# Patient Record
Sex: Male | Born: 1985
Health system: Southern US, Community
[De-identification: ages and names within clinical notes are randomized; demographics above are authoritative.]

## PROBLEM LIST (undated history)

## (undated) ENCOUNTER — Emergency Department (HOSPITAL_COMMUNITY): Admission: EM | Discharge status: Absent without leave | Payer: Private Health Insurance - Indemnity

## (undated) DIAGNOSIS — T7840XA Allergy, unspecified, initial encounter: Secondary | ICD-10-CM

## (undated) DIAGNOSIS — J45909 Unspecified asthma, uncomplicated: Secondary | ICD-10-CM

## (undated) HISTORY — DX: Allergy, unspecified, initial encounter: T78.40XA

## (undated) HISTORY — PX: NO PAST SURGERIES: SHX2092

## (undated) HISTORY — DX: Unspecified asthma, uncomplicated: J45.909

---

## 2012-06-17 ENCOUNTER — Ambulatory Visit: Payer: Self-pay | Admitting: Physician Assistant

## 2012-06-17 VITALS — BP 127/86 | HR 60 | Temp 98.1°F | Resp 16 | Ht 70.5 in | Wt 153.0 lb

## 2012-06-17 DIAGNOSIS — J45909 Unspecified asthma, uncomplicated: Secondary | ICD-10-CM

## 2012-06-17 MED ORDER — ALBUTEROL SULFATE HFA 108 (90 BASE) MCG/ACT IN AERS: 2.0000 | INHALATION_SPRAY | RESPIRATORY_TRACT | Status: DC | PRN

## 2012-06-17 NOTE — Progress Notes (Signed)
  Subjective:    Patient ID: Noah Romero, male    DOB: 17-May-1986, 26 y.o.   MRN: [id number]  HPI Patient presents for refill of albuterol inhaler. Has well controlled asthma that only flares when exposed to environmental allergens. Typically uses his inhaler 1 or 2 times per month. Is here for a refill because he is going to [place] for 13 days and wants to have an extra just in case. Currently is out of his albuterol after increase in frequency due to cleaning the basement of his house.  No wheezing, SOB, nasal congestion, sore throat, or otalgia.     Review of Systems  All other systems reviewed and are negative.       Objective:   Physical Exam  Constitutional: He is oriented to person, place, and time. He appears well-developed and well-nourished.  HENT:  Head: Normocephalic and atraumatic.  Right Ear: Hearing, tympanic membrane, external ear and ear canal normal.  Left Ear: Hearing, tympanic membrane, external ear and ear canal normal.  Mouth/Throat: Uvula is midline, oropharynx is clear and moist and mucous membranes are normal.  Eyes: Conjunctivae are normal.  Neck: Normal range of motion.  Cardiovascular: Normal rate, regular rhythm and normal heart sounds.   Pulmonary/Chest: Effort normal and breath sounds normal.  Lymphadenopathy:    He has no cervical adenopathy.  Neurological: He is alert and oriented to person, place, and time.  Psychiatric: He has a normal mood and affect. His behavior is normal. Judgment and thought content normal.          Assessment & Plan:   1. Asthma  Well controlled. Albuterol refilled for him to use as needed for wheezing Also recommend he take OTC Claritin or Zyrtec with him while he is in [place] to prevent flares due to allergens.

## 2013-09-13 ENCOUNTER — Ambulatory Visit (INDEPENDENT_AMBULATORY_CARE_PROVIDER_SITE_OTHER): Payer: Private Health Insurance - Indemnity | Admitting: Internal Medicine

## 2013-09-13 ENCOUNTER — Encounter: Payer: Self-pay | Admitting: Internal Medicine

## 2013-09-13 VITALS — BP 153/93 | HR 62 | Temp 98.8°F | Ht 71.2 in | Wt 171.8 lb

## 2013-09-13 DIAGNOSIS — Z0000 Encounter for general adult medical examination without abnormal findings: Secondary | ICD-10-CM

## 2013-09-13 DIAGNOSIS — J45909 Unspecified asthma, uncomplicated: Secondary | ICD-10-CM | POA: Insufficient documentation

## 2013-09-13 MED ORDER — ALBUTEROL SULFATE HFA 108 (90 BASE) MCG/ACT IN AERS: 2.0000 | INHALATION_SPRAY | Freq: Four times a day (QID) | RESPIRATORY_TRACT | Status: DC | PRN

## 2013-09-13 NOTE — Patient Instructions (Signed)
Is come back fasting: FLP, CMP, CBC, TSH---- dx v70    Testicular Problems and Self-Exam Men can examine themselves easily and effectively with positive results. Monthly exams detect problems early and save lives. There are numerous causes of swelling in the testicle. Testicular cancer usually appears as a firm painless lump in the front part of the testicle. This may feel like a dull ache or heavy feeling located in the lower abdomen (belly), groin, or scrotum.  The risk is greater in men with undescended testicles and it is more common in young men. It is responsible for almost a fifth of cancers in males between ages 1 and 2. Other common causes of swellings, lumps, and testicular pain include injuries, inflammation (soreness) from infection, hydrocele, and torsion. These are a few of the reasons to do monthly self-examination of the testicles. The exam only takes minutes and could add years to your life. Get in the habit! SELF-EXAMINATION OF THE TESTICLES The testicles are easiest to examine after warm baths or showers and are more difficult to examine when you are cold. This is because the muscles attached to the testicles retract and pull them up higher or into the abdomen. While standing, roll one testicle between the thumb and forefinger. Feel for lumps, swelling, or discomfort. A normal testicle is egg shaped and feels firm. It is smooth and not tender. The spermatic cord can be felt as a firm spaghetti-like cord at the back of the testicle. It is also important to examine your groins. This is the crease between the front of your leg and your abdomen. Also, feel for enlarged lymph nodes (glands). Enlarged nodes are also a cause for you to see your caregiver for evaluation.  Self-examination of the testicles and groin areas on a regular basis will help you to know what your own testicles and groins feel like. This will help you pick up an abnormality (difference) at an earlier stage. Early  discovery is the key to curing this cancer or treating other conditions. Any lump, change, or swelling in the testicle calls for immediate evaluation by your caregiver. Cancer of the testicle does not result in impotence and it does not prevent normal intercourse or prevent having children. If your caregiver feels that medical treatment or chemotherapy could lead to infertility, sperm can be frozen for future use. It is necessary to see a caregiver as soon as possible after the discovery of a lump in a testicle. Document Released: 03/17/2001 Document Revised: 03/02/2012 Document Reviewed: 12/10/2008 [facility] Patient Information 2014 [place], [place].
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
## 2013-09-13 NOTE — Assessment & Plan Note (Signed)
Asthma as a child, hardly ever has any symptoms, will prescribe albuterol just in case.

## 2013-09-13 NOTE — Progress Notes (Signed)
  Subjective:    Patient ID: Noah Romero, male    DOB: 28-Jan-1986, 27 y.o.   MRN: [id number]  HPI New pt, requests a CPX  Past Medical History  Diagnosis Date  . Asthma    Past Surgical History  Procedure Laterality Date  . No past surgeries     History   Social History  . Marital Status: Single    Spouse Name: N/A    Number of Children: 0  . Years of Education: N/A   Occupational History  . bartender , college    Social History Main Topics  . Smoking status: Never Smoker   . Smokeless tobacco: Never Used  . Alcohol Use: Yes     Comment: socially  . Drug Use: No  . Sexual Activity: Not on file   Other Topics Concern  . Not on file   Social History Narrative   Attends college         Family History  Problem Relation Age of Onset  . Colon cancer Neg Hx   . Prostate cancer Other     GF dx in his 37  . CAD Other     GF  . Diabetes Neg Hx       Review of Systems Diet-- regular, plans to improve Exercise-- rock climbing, gym , active ~ 4 times per week No  CP, SOB, syncope No nausea, vomiting diarrhea No blood in the stools No cough, sputum production, no wheezing at present No dysuria, gross hematuria, difficulty urinating   No anxiety, depression Single but has a g-friend, not high risk sexual behavior        Objective:   Physical Exam BP 153/93  Pulse 62  Temp(Src) 98.8 F (37.1 C)  Ht 5' 11.2" (1.808 m)  Wt 171 lb 12.8 oz (77.928 kg)  BMI 23.84 kg/m2  SpO2 99% General -- alert, well-developed, NAD.  Neck --no thyromegaly , normal carotid pulse Lungs -- normal respiratory effort, no intercostal retractions, no accessory muscle use, and normal breath sounds.  Heart-- normal rate, regular rhythm, no murmur.  Abdomen-- Not distended, good bowel sounds,soft, non-tender. No rebound or rigidity.  Extremities-- no pretibial edema bilaterally  Neurologic--  alert & oriented X3. Speech normal, gait normal, strength normal in all extremities.    Psych-- Cognition and judgment appear intact. Cooperative with normal attention span and concentration. No anxious appearing , no depressed appearing.      Assessment & Plan:

## 2013-09-16 ENCOUNTER — Other Ambulatory Visit (INDEPENDENT_AMBULATORY_CARE_PROVIDER_SITE_OTHER): Payer: Private Health Insurance - Indemnity

## 2013-09-16 DIAGNOSIS — Z0000 Encounter for general adult medical examination without abnormal findings: Secondary | ICD-10-CM

## 2013-09-16 LAB — CBC WITH DIFFERENTIAL/PLATELET
Basophils Absolute: 0.1 10*3/uL (ref 0.0–0.1)
Basophils Relative: 1 % (ref 0.0–3.0)
Eosinophils Absolute: 0.3 10*3/uL (ref 0.0–0.7)
HCT: 41.5 % (ref 39.0–52.0)
Hemoglobin: 14.3 g/dL (ref 13.0–17.0)
Lymphocytes Relative: 47.9 % — ABNORMAL HIGH (ref 12.0–46.0)
Lymphs Abs: 2.5 10*3/uL (ref 0.7–4.0)
MCHC: 34.5 g/dL (ref 30.0–36.0)
MCV: 90.7 fl (ref 78.0–100.0)
Neutro Abs: 2 10*3/uL (ref 1.4–7.7)
RBC: 4.57 Mil/uL (ref 4.22–5.81)
RDW: 13.2 % (ref 11.5–14.6)

## 2013-09-16 LAB — COMPREHENSIVE METABOLIC PANEL
ALT: 35 U/L (ref 0–53)
AST: 29 U/L (ref 0–37)
Alkaline Phosphatase: 59 U/L (ref 39–117)
BUN: 13 mg/dL (ref 6–23)
Calcium: 9.5 mg/dL (ref 8.4–10.5)
Chloride: 100 mEq/L (ref 96–112)
Creatinine, Ser: 1 mg/dL (ref 0.4–1.5)
Total Bilirubin: 1.4 mg/dL — ABNORMAL HIGH (ref 0.3–1.2)

## 2013-09-16 LAB — LIPID PANEL
Cholesterol: 162 mg/dL (ref 0–200)
HDL: 60.2 mg/dL (ref >39.00)
LDL Cholesterol: 92 mg/dL (ref 0–99)
Total CHOL/HDL Ratio: 3
Triglycerides: 48 mg/dL (ref 0.0–149.0)
VLDL: 9.6 mg/dL (ref 0.0–40.0)

## 2013-09-20 ENCOUNTER — Encounter: Payer: Self-pay | Admitting: *Deleted

## 2013-09-20 ENCOUNTER — Telehealth: Payer: Self-pay | Admitting: *Deleted

## 2013-09-20 NOTE — Telephone Encounter (Signed)
Message copied by Eustace Quail on Mon Sep 20, 2013  1:29 PM ------      Message from: Wanda Plump      Created: Sat Sep 18, 2013  2:59 PM       Send a letter      Noah Romero,      Your cholesterol is very good at 162.      The the liver, kidney, potassium, blood count and thyroid tests are all normal.      These are good results! ------

## 2013-09-20 NOTE — Telephone Encounter (Signed)
Left message on patient voice mail that physical form was ready to be picked up.

## 2013-09-20 NOTE — Telephone Encounter (Signed)
Letter sent. DJR  

## 2013-09-21 ENCOUNTER — Ambulatory Visit (INDEPENDENT_AMBULATORY_CARE_PROVIDER_SITE_OTHER): Payer: Private Health Insurance - Indemnity

## 2013-09-21 DIAGNOSIS — Z23 Encounter for immunization: Secondary | ICD-10-CM

## 2013-12-24 ENCOUNTER — Encounter: Payer: Self-pay | Admitting: *Deleted

## 2013-12-24 NOTE — Telephone Encounter (Signed)
12/24/2013 Original form was at front desk.  Faxed to [phone].  Sent copy to batch.  Mailed original to patient.  bw

## 2015-01-13 ENCOUNTER — Emergency Department (HOSPITAL_COMMUNITY): Payer: Self-pay

## 2015-01-13 ENCOUNTER — Observation Stay (HOSPITAL_COMMUNITY)
Admission: EM | Admit: 2015-01-13 | Discharge: 2015-01-13 | Disposition: A | Discharge status: Home / Self Care | Payer: Self-pay | Attending: General Surgery | Admitting: General Surgery

## 2015-01-13 ENCOUNTER — Encounter (HOSPITAL_COMMUNITY): Payer: Self-pay | Admitting: Radiology

## 2015-01-13 DIAGNOSIS — J384 Edema of larynx: Secondary | ICD-10-CM | POA: Insufficient documentation

## 2015-01-13 DIAGNOSIS — S060XAA Concussion with loss of consciousness status unknown, initial encounter: Secondary | ICD-10-CM | POA: Diagnosis present

## 2015-01-13 DIAGNOSIS — S0102XA Laceration with foreign body of scalp, initial encounter: Principal | ICD-10-CM | POA: Insufficient documentation

## 2015-01-13 DIAGNOSIS — S0001XA Abrasion of scalp, initial encounter: Secondary | ICD-10-CM | POA: Diagnosis present

## 2015-01-13 DIAGNOSIS — S060X0A Concussion without loss of consciousness, initial encounter: Secondary | ICD-10-CM | POA: Insufficient documentation

## 2015-01-13 DIAGNOSIS — Y92410 Unspecified street and highway as the place of occurrence of the external cause: Secondary | ICD-10-CM | POA: Insufficient documentation

## 2015-01-13 DIAGNOSIS — M25531 Pain in right wrist: Secondary | ICD-10-CM | POA: Insufficient documentation

## 2015-01-13 DIAGNOSIS — S060X9A Concussion with loss of consciousness of unspecified duration, initial encounter: Secondary | ICD-10-CM | POA: Diagnosis present

## 2015-01-13 LAB — PROTIME-INR
INR: 1.05 (ref 0.00–1.49)
Prothrombin Time: 13.8 seconds (ref 11.6–15.2)

## 2015-01-13 LAB — URINALYSIS, ROUTINE W REFLEX MICROSCOPIC
Bilirubin Urine: NEGATIVE
GLUCOSE, UA: NEGATIVE mg/dL
Hgb urine dipstick: NEGATIVE
Ketones, ur: NEGATIVE mg/dL
LEUKOCYTES UA: NEGATIVE
Nitrite: NEGATIVE
PROTEIN: NEGATIVE mg/dL
SPECIFIC GRAVITY, URINE: 1.02 (ref 1.005–1.030)
UROBILINOGEN UA: 0.2 mg/dL (ref 0.0–1.0)
pH: 6.5 (ref 5.0–8.0)

## 2015-01-13 LAB — COMPREHENSIVE METABOLIC PANEL
ALBUMIN: 4.6 g/dL (ref 3.5–5.2)
ALT: 38 U/L (ref 0–53)
ANION GAP: 15 (ref 5–15)
AST: 43 U/L — AB (ref 0–37)
Alkaline Phosphatase: 63 U/L (ref 39–117)
BUN: 8 mg/dL (ref 6–23)
CALCIUM: 8.9 mg/dL (ref 8.4–10.5)
CHLORIDE: 102 meq/L (ref 96–112)
CO2: 23 mmol/L (ref 19–32)
CREATININE: 1.04 mg/dL (ref 0.50–1.35)
GFR calc non Af Amer: >90 mL/min (ref >90)
GLUCOSE: 144 mg/dL — AB (ref 70–99)
Potassium: 3.1 mmol/L — ABNORMAL LOW (ref 3.5–5.1)
SODIUM: 140 mmol/L (ref 135–145)
Total Bilirubin: 0.7 mg/dL (ref 0.3–1.2)
Total Protein: 8 g/dL (ref 6.0–8.3)

## 2015-01-13 LAB — CBC
HCT: 40.3 % (ref 39.0–52.0)
HEMOGLOBIN: 14.1 g/dL (ref 13.0–17.0)
MCH: 32.3 pg (ref 26.0–34.0)
MCHC: 35 g/dL (ref 30.0–36.0)
MCV: 92.2 fL (ref 78.0–100.0)
Platelets: 323 10*3/uL (ref 150–400)
RBC: 4.37 MIL/uL (ref 4.22–5.81)
RDW: 13 % (ref 11.5–15.5)
WBC: 7.4 10*3/uL (ref 4.0–10.5)

## 2015-01-13 LAB — I-STAT CHEM 8, ED
BUN: 10 mg/dL (ref 6–23)
CALCIUM ION: 1 mmol/L — AB (ref 1.12–1.23)
CHLORIDE: 102 meq/L (ref 96–112)
Creatinine, Ser: 1.5 mg/dL — ABNORMAL HIGH (ref 0.50–1.35)
GLUCOSE: 146 mg/dL — AB (ref 70–99)
HEMATOCRIT: 47 % (ref 39.0–52.0)
Hemoglobin: 16 g/dL (ref 13.0–17.0)
Potassium: 3.2 mmol/L — ABNORMAL LOW (ref 3.5–5.1)
SODIUM: 141 mmol/L (ref 135–145)
TCO2: 18 mmol/L (ref 0–100)

## 2015-01-13 LAB — TYPE AND SCREEN
ABO/RH(D): O POS
Antibody Screen: NEGATIVE
Unit division: 0
Unit division: 0

## 2015-01-13 LAB — ABO/RH: ABO/RH(D): O POS

## 2015-01-13 LAB — ETHANOL: Alcohol, Ethyl (B): 391 mg/dL — ABNORMAL HIGH (ref 0–9)

## 2015-01-13 LAB — BASIC METABOLIC PANEL
ANION GAP: 9 (ref 5–15)
BUN: 7 mg/dL (ref 6–23)
CHLORIDE: 102 mmol/L (ref 96–112)
CO2: 26 mmol/L (ref 19–32)
Calcium: 9.2 mg/dL (ref 8.4–10.5)
Creatinine, Ser: 0.93 mg/dL (ref 0.50–1.35)
GFR calc Af Amer: >90 mL/min (ref >90)
GFR calc non Af Amer: >90 mL/min (ref >90)
GLUCOSE: 96 mg/dL (ref 70–99)
Potassium: 3.7 mmol/L (ref 3.5–5.1)
Sodium: 137 mmol/L (ref 135–145)

## 2015-01-13 LAB — CDS SEROLOGY

## 2015-01-13 LAB — I-STAT CG4 LACTIC ACID, ED: Lactic Acid, Venous: 4.78 mmol/L (ref 0.5–2.0)

## 2015-01-13 MED ORDER — MORPHINE SULFATE 2 MG/ML IJ SOLN: 2.0000 mg | INTRAMUSCULAR | Status: DC | PRN

## 2015-01-13 MED ORDER — KCL IN DEXTROSE-NACL 20-5-0.45 MEQ/L-%-% IV SOLN
INTRAVENOUS | Status: DC
  Administered 2015-01-13: 05:00:00 via INTRAVENOUS
  Filled 2015-01-13: qty 1000

## 2015-01-13 MED ORDER — LIDOCAINE-EPINEPHRINE (PF) 2 %-1:200000 IJ SOLN
20.0000 mL | Freq: Once | INTRAMUSCULAR | Status: AC
  Administered 2015-01-13: 20 mL
  Filled 2015-01-13: qty 20

## 2015-01-13 MED ORDER — PANTOPRAZOLE SODIUM 40 MG IV SOLR: 40.0000 mg | Freq: Every day | INTRAVENOUS | Status: DC

## 2015-01-13 MED ORDER — ONDANSETRON HCL 4 MG/2ML IJ SOLN: 4.0000 mg | Freq: Four times a day (QID) | INTRAMUSCULAR | Status: DC | PRN

## 2015-01-13 MED ORDER — ETOMIDATE 2 MG/ML IV SOLN
INTRAVENOUS | Status: AC
  Filled 2015-01-13: qty 20

## 2015-01-13 MED ORDER — IOHEXOL 300 MG/ML  SOLN
100.0000 mL | Freq: Once | INTRAMUSCULAR | Status: AC | PRN
  Administered 2015-01-13: 100 mL via INTRAVENOUS

## 2015-01-13 MED ORDER — CEFAZOLIN SODIUM-DEXTROSE 2-3 GM-% IV SOLR
2.0000 g | Freq: Once | INTRAVENOUS | Status: AC
  Administered 2015-01-13: 2 g via INTRAVENOUS
  Filled 2015-01-13: qty 50

## 2015-01-13 MED ORDER — OXYCODONE HCL 5 MG PO TABS: 10.0000 mg | ORAL_TABLET | ORAL | Status: DC | PRN

## 2015-01-13 MED ORDER — ONDANSETRON HCL 4 MG PO TABS: 4.0000 mg | ORAL_TABLET | Freq: Four times a day (QID) | ORAL | Status: DC | PRN

## 2015-01-13 MED ORDER — PANTOPRAZOLE SODIUM 40 MG PO TBEC: 40.0000 mg | DELAYED_RELEASE_TABLET | Freq: Every day | ORAL | Status: DC

## 2015-01-13 MED ORDER — ACETAMINOPHEN 325 MG PO TABS
650.0000 mg | ORAL_TABLET | ORAL | Status: DC | PRN
  Administered 2015-01-13: 650 mg via ORAL
  Filled 2015-01-13: qty 2

## 2015-01-13 MED ORDER — ROCURONIUM BROMIDE 50 MG/5ML IV SOLN
INTRAVENOUS | Status: AC
  Filled 2015-01-13: qty 2

## 2015-01-13 MED ORDER — LIDOCAINE HCL (CARDIAC) 20 MG/ML IV SOLN
INTRAVENOUS | Status: AC
  Filled 2015-01-13: qty 5

## 2015-01-13 MED ORDER — ENOXAPARIN SODIUM 40 MG/0.4ML ~~LOC~~ SOLN
40.0000 mg | SUBCUTANEOUS | Status: DC
  Administered 2015-01-13: 40 mg via SUBCUTANEOUS
  Filled 2015-01-13: qty 0.4

## 2015-01-13 MED ORDER — SUCCINYLCHOLINE CHLORIDE 20 MG/ML IJ SOLN
INTRAMUSCULAR | Status: AC
  Filled 2015-01-13: qty 1

## 2015-01-13 MED ORDER — SODIUM CHLORIDE 0.9 % IV SOLN
Freq: Once | INTRAVENOUS | Status: AC
  Administered 2015-01-13: 01:00:00 via INTRAVENOUS

## 2015-01-13 MED ORDER — OXYCODONE-ACETAMINOPHEN 5-325 MG PO TABS: 1.0000 | ORAL_TABLET | ORAL | Status: DC | PRN

## 2015-01-13 MED ORDER — PHENOL 1.4 % MT LIQD: 1.0000 | OROMUCOSAL | Status: DC | PRN

## 2015-01-13 MED ORDER — OXYCODONE HCL 5 MG PO TABS
5.0000 mg | ORAL_TABLET | ORAL | Status: DC | PRN
  Administered 2015-01-13 (×2): 5 mg via ORAL
  Filled 2015-01-13 (×2): qty 1
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
## 2015-01-13 NOTE — Progress Notes (Signed)
Patient been D/c. D/c instruciton given. Patient verbalized understanding.

## 2015-01-13 NOTE — ED Notes (Signed)
The staff listed as arriving at 0058 really arrived at [facility]0020

## 2015-01-13 NOTE — Discharge Instructions (Signed)
Wash wounds daily in shower with soap and water. Do not soak. Place moist dressing in wound and cover with dry dressing twice daily.  No driving while taking oxycodone.
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
## 2015-01-13 NOTE — Progress Notes (Signed)
Pt arrived to floor 4N28 from ED. Fiance currently at bedside. Alert and oriented x4. Pt c/o of pain mainly to be in his throat. It is painful to swallow. Pt back of throat was found to be coated white. Offered chloraseptic spray and pain medicine, but pt states he does not need it at this time. Guaze bandage placed on right wrist. Will continue to monitor.

## 2015-01-13 NOTE — Progress Notes (Signed)
Pt enrolled in MATCH program for medication assistance.  Provided with letter, list of participating pharmacies and an explanation of the process. Pt/caregiver stated understanding.  Yochanan Eddleman, RN BSN MHA CCM  Case Manager, Trauma Service/Unit 3M [phone]  

## 2015-01-13 NOTE — Progress Notes (Signed)
UR completed 
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
## 2015-01-13 NOTE — ED Notes (Signed)
Pt transported to CT ?

## 2015-01-14 LAB — PREPARE FRESH FROZEN PLASMA
UNIT DIVISION: 0
Unit division: 0

## 2015-01-16 ENCOUNTER — Encounter: Payer: Self-pay | Admitting: Internal Medicine

## 2015-01-17 ENCOUNTER — Other Ambulatory Visit (HOSPITAL_COMMUNITY): Payer: Self-pay | Admitting: Emergency Medicine

## 2015-01-18 MED ORDER — OXYCODONE-ACETAMINOPHEN 5-325 MG PO TABS: 1.0000 | ORAL_TABLET | ORAL | Status: DC | PRN

## 2015-01-18 NOTE — Telephone Encounter (Signed)
Patient called to say he was still having a fair amount of pain with dressing changes and wanted to know if he could get a refill on his pain medication. I wrote a prescription for Perc 5/325, #50 that he will come pick up.

## 2015-05-31 ENCOUNTER — Ambulatory Visit (HOSPITAL_BASED_OUTPATIENT_CLINIC_OR_DEPARTMENT_OTHER): Admit: 2015-05-31 | Payer: Private Health Insurance - Indemnity | Admitting: Plastic Surgery

## 2015-05-31 ENCOUNTER — Encounter (HOSPITAL_BASED_OUTPATIENT_CLINIC_OR_DEPARTMENT_OTHER): Payer: Self-pay

## 2015-05-31 SURGERY — REVISION, SCAR
Anesthesia: General

## 2015-06-05 ENCOUNTER — Ambulatory Visit (INDEPENDENT_AMBULATORY_CARE_PROVIDER_SITE_OTHER): Payer: 59 | Admitting: Family Medicine

## 2015-06-05 ENCOUNTER — Ambulatory Visit (INDEPENDENT_AMBULATORY_CARE_PROVIDER_SITE_OTHER): Payer: 59

## 2015-06-05 VITALS — BP 138/82 | HR 69 | Temp 98.7°F | Resp 14 | Ht 70.5 in | Wt 165.0 lb

## 2015-06-05 DIAGNOSIS — M25572 Pain in left ankle and joints of left foot: Secondary | ICD-10-CM | POA: Diagnosis not present

## 2015-06-05 DIAGNOSIS — S93432A Sprain of tibiofibular ligament of left ankle, initial encounter: Secondary | ICD-10-CM | POA: Diagnosis not present

## 2015-06-05 DIAGNOSIS — S93492A Sprain of other ligament of left ankle, initial encounter: Secondary | ICD-10-CM

## 2015-06-05 NOTE — Patient Instructions (Signed)
Gentle range of motion for the next couple days, nonweightbearing until Thursday, use the Cam Walker after Wednesday, crutches until then and may return to work on Thursday evening. This is a significant sprain but it should heal completely with rest and elevation over the next several days and then taking care over the next week to protect it with the Cam Walker.

## 2015-06-05 NOTE — Progress Notes (Signed)
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 Late last Tuesday (6 days ago) patient jumped down from the table and rolled his ankle. He was able to walk at all and in fact was able to walk the next day. By Thursday, the pain has gotten some benefit could not wear weight. He has not been back to work since.  2 days ago patient noticed significant amount of ecchymosis on the lateral plantar aspect of his heel. He's been using crutches and has been having continued swelling in the foot over the last several days.  Patient has pain when bearing weight now. He is able to wiggle his toes and move his foot back and forth and does not notice any bony abnormality.  Objective: No acute distress, patient using crutches, BP 138/82 mmHg  Pulse 69  Temp(Src) 98.7 F (37.1 C) (Oral)  Resp 14  Ht 5' 10.5" (1.791 m)  Wt 165 lb (74.844 kg)  BMI 23.33 kg/m2  SpO2 98% Examination of the left foot and ankle reveal marked ecchymosis below the left lateral malleolus with no point tenderness in the foot or over the lateral malleolus. There is marked swelling of the dorsal foot as well as lateral aspect of the ankle. He is able to move the foot up and down (dorsal and lateral flexion)  [facility] reading (PRIMARY) by  Dr. Milus Glazier  Left ankle. Negative except for soft tissue swelling  Assessment: High ankle sprain with significant swelling. Unable to weight-bear for another 2-3 days and then may go back to work using the [employer].  Patient instructed to call me if swelling continues or pain worsens.  Signed, Elvina Sidle M.D. :

## 2015-06-21 ENCOUNTER — Telehealth: Payer: Self-pay | Admitting: Family Medicine

## 2015-06-21 NOTE — Telephone Encounter (Signed)
lmom that his out of work note is ready here at the [facility]

## 2016-01-03 ENCOUNTER — Encounter: Payer: Self-pay | Admitting: Family Medicine

## 2016-01-03 ENCOUNTER — Ambulatory Visit (INDEPENDENT_AMBULATORY_CARE_PROVIDER_SITE_OTHER): Payer: BLUE CROSS/BLUE SHIELD | Admitting: Family Medicine

## 2016-01-03 VITALS — BP 138/88 | HR 65 | Temp 98.3°F | Resp 16 | Ht 71.0 in | Wt 185.0 lb

## 2016-01-03 DIAGNOSIS — Z23 Encounter for immunization: Secondary | ICD-10-CM | POA: Diagnosis not present

## 2016-01-03 DIAGNOSIS — J4520 Mild intermittent asthma, uncomplicated: Secondary | ICD-10-CM | POA: Diagnosis not present

## 2016-01-03 DIAGNOSIS — Z0000 Encounter for general adult medical examination without abnormal findings: Secondary | ICD-10-CM

## 2016-01-03 MED ORDER — ALBUTEROL SULFATE HFA 108 (90 BASE) MCG/ACT IN AERS: 2.0000 | INHALATION_SPRAY | Freq: Four times a day (QID) | RESPIRATORY_TRACT | Status: DC | PRN

## 2016-01-03 NOTE — Assessment & Plan Note (Addendum)
Mild intermittent. Infrequent exacerbations. Renewed albuterol inhaler today. Encouraged preventative 2 puffs of inhaler prior to exercise.  Follow-up 6 mos or as needed.

## 2016-01-03 NOTE — Patient Instructions (Signed)
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
## 2019-08-23 ENCOUNTER — Other Ambulatory Visit: Payer: Self-pay

## 2019-08-24 ENCOUNTER — Encounter: Payer: Self-pay | Admitting: Family Medicine

## 2019-08-24 ENCOUNTER — Ambulatory Visit: Payer: 59 | Admitting: Family Medicine

## 2019-08-24 VITALS — BP 180/114 | HR 62 | Temp 98.2°F | Ht 70.0 in | Wt 182.8 lb

## 2019-08-24 DIAGNOSIS — Z23 Encounter for immunization: Secondary | ICD-10-CM

## 2019-08-24 DIAGNOSIS — J4520 Mild intermittent asthma, uncomplicated: Secondary | ICD-10-CM

## 2019-08-24 DIAGNOSIS — Z0000 Encounter for general adult medical examination without abnormal findings: Secondary | ICD-10-CM | POA: Insufficient documentation

## 2019-08-24 DIAGNOSIS — Z13220 Encounter for screening for lipoid disorders: Secondary | ICD-10-CM

## 2019-08-24 DIAGNOSIS — Z0001 Encounter for general adult medical examination with abnormal findings: Secondary | ICD-10-CM

## 2019-08-24 DIAGNOSIS — I10 Essential (primary) hypertension: Secondary | ICD-10-CM

## 2019-08-24 LAB — TSH: TSH: 2.49 u[IU]/mL (ref 0.35–4.50)

## 2019-08-24 LAB — COMPREHENSIVE METABOLIC PANEL
ALT: 65 U/L — ABNORMAL HIGH (ref 0–53)
AST: 52 U/L — ABNORMAL HIGH (ref 0–37)
Albumin: 4.7 g/dL (ref 3.5–5.2)
Alkaline Phosphatase: 72 U/L (ref 39–117)
BUN: 18 mg/dL (ref 6–23)
CO2: 27 mEq/L (ref 19–32)
Calcium: 10.1 mg/dL (ref 8.4–10.5)
Chloride: 100 mEq/L (ref 96–112)
Creatinine, Ser: 0.84 mg/dL (ref 0.40–1.50)
GFR: 105.67 mL/min (ref >60.00)
Glucose, Bld: 96 mg/dL (ref 70–99)
Potassium: 4.9 mEq/L (ref 3.5–5.1)
Sodium: 135 mEq/L (ref 135–145)
Total Bilirubin: 1.3 mg/dL — ABNORMAL HIGH (ref 0.2–1.2)
Total Protein: 8.1 g/dL (ref 6.0–8.3)

## 2019-08-24 LAB — LIPID PANEL
Cholesterol: 231 mg/dL — ABNORMAL HIGH (ref 0–200)
HDL: 62.1 mg/dL (ref >39.00)
LDL Cholesterol: 147 mg/dL — ABNORMAL HIGH (ref 0–99)
NonHDL: 168.8
Total CHOL/HDL Ratio: 4
Triglycerides: 111 mg/dL (ref 0.0–149.0)
VLDL: 22.2 mg/dL (ref 0.0–40.0)

## 2019-08-24 LAB — CBC WITH DIFFERENTIAL/PLATELET
Basophils Absolute: 0 10*3/uL (ref 0.0–0.1)
Basophils Relative: 0.7 % (ref 0.0–3.0)
Eosinophils Absolute: 0.4 10*3/uL (ref 0.0–0.7)
Eosinophils Relative: 5.6 % — ABNORMAL HIGH (ref 0.0–5.0)
HCT: 43.4 % (ref 39.0–52.0)
Hemoglobin: 14.6 g/dL (ref 13.0–17.0)
Lymphocytes Relative: 22.2 % (ref 12.0–46.0)
Lymphs Abs: 1.4 10*3/uL (ref 0.7–4.0)
MCHC: 33.7 g/dL (ref 30.0–36.0)
MCV: 95.7 fl (ref 78.0–100.0)
Monocytes Absolute: 0.5 10*3/uL (ref 0.1–1.0)
Monocytes Relative: 7 % (ref 3.0–12.0)
Neutro Abs: 4.2 10*3/uL (ref 1.4–7.7)
Neutrophils Relative %: 64.5 % (ref 43.0–77.0)
Platelets: 319 10*3/uL (ref 150.0–400.0)
RBC: 4.53 Mil/uL (ref 4.22–5.81)
RDW: 13.3 % (ref 11.5–15.5)
WBC: 6.5 10*3/uL (ref 4.0–10.5)

## 2019-08-24 MED ORDER — ALBUTEROL SULFATE HFA 108 (90 BASE) MCG/ACT IN AERS: 2.0000 | INHALATION_SPRAY | Freq: Four times a day (QID) | RESPIRATORY_TRACT | 6 refills | Status: DC | PRN

## 2019-08-24 MED ORDER — OLMESARTAN MEDOXOMIL 20 MG PO TABS: 10.0000 mg | ORAL_TABLET | Freq: Every day | ORAL | 3 refills | Status: DC

## 2019-08-24 NOTE — Progress Notes (Signed)
Noah Romero - 33 y.o. male MRN [id number]  Date of birth: [birth year]  Subjective Chief Complaint  Patient presents with  . Establish Care    est care/ prescription for albuterol/ CPE- last ate7:30/ wants flu shot     HPI Noah Romero is a 33 y.o. male with history of asthma here today for initial visit and annual exam.  He reports that asthma has been well controlled and only using albuterol during peak allergy season.  His BP is elevated today and he reports he has had high readings in the past.  He thought it may be due to "white coat" but he has also had some high readings at home, albeit not as high as it has been in clinic.  He does stay pretty active, walking about 3 miles per day and doing some strength training daily.  He was running more previously and plans to return to this once the weather is cooler.  He follows a healthy diet and is a non-smoker.  He drinks EtOH a few nights per week, anywhere from 1 glass of wine to a couple of beers.    Review of Systems  Constitutional: Negative for chills, fever, malaise/fatigue and weight loss.  HENT: Negative for congestion, ear pain and sore throat.   Eyes: Negative for blurred vision, double vision and pain.  Respiratory: Negative for cough and shortness of breath.   Cardiovascular: Negative for chest pain and palpitations.  Gastrointestinal: Negative for abdominal pain, blood in stool, constipation, heartburn and nausea.  Genitourinary: Negative for dysuria and urgency.  Musculoskeletal: Negative for joint pain and myalgias.  Neurological: Negative for dizziness and headaches.  Endo/Heme/Allergies: Does not bruise/bleed easily.  Psychiatric/Behavioral: Negative for depression. The patient is not nervous/anxious and does not have insomnia.     No Known Allergies  Past Medical History:  Diagnosis Date  . Allergy   . Asthma     Past Surgical History:  Procedure Laterality Date  . NO PAST SURGERIES      Social History    Socioeconomic History  . Marital status: Single    Spouse name: Not on file  . Number of children: 0  . Years of education: Not on file  . Highest education level: Not on file  Occupational History  . Occupation: Chief Operating Officer , Publishing copy: [employer]  . Financial resource strain: Not on file  . Food insecurity    Worry: Not on file    Inability: Not on file  . Transportation needs    Medical: Not on file    Non-medical: Not on file  Tobacco Use  . Smoking status: Never Smoker  . Smokeless tobacco: Never Used  Substance and Sexual Activity  . Alcohol use: Yes    Comment: socially  . Drug use: No  . Sexual activity: Not on file  Lifestyle  . Physical activity    Days per week: Not on file    Minutes per session: Not on file  . Stress: Not on file  Relationships  . Social Herbalist on phone: Not on file    Gets together: Not on file    Attends religious service: Not on file    Active member of club or organization: Not on file    Attends meetings of clubs or organizations: Not on file    Relationship status: Not on file  Other Topics Concern  . Not on file  Social History Narrative   **  Merged History Encounter **       Attends college          Family History  Problem Relation Age of Onset  . Diabetes Mother   . Hyperlipidemia Father   . Hypertension Father   . Birth defects Paternal Grandfather   . Prostate cancer Other        GF dx in his 75  . CAD Other        GF  . Colon cancer Neg Hx     Health Maintenance  Topic Date Due  . INFLUENZA VACCINE  07/24/2019  . TETANUS/TDAP  12/23/2020  . HIV Screening  Completed    ----------------------------------------------------------------------------------------------------------------------------------------------------------------------------------------------------------------- Physical Exam BP (!) 180/114   Pulse 62   Temp 98.2 F (36.8 C) (Oral)   Ht '5\' 10"'$  (1.778 m)    Wt 182 lb 12.8 oz (82.9 kg)   SpO2 98%   BMI 26.23 kg/m   Physical Exam Constitutional:      General: He is not in acute distress. HENT:     Head: Normocephalic and atraumatic.     Right Ear: Tympanic membrane and external ear normal.     Left Ear: Tympanic membrane and external ear normal.     Mouth/Throat:     Mouth: Mucous membranes are moist.  Eyes:     General: No scleral icterus. Neck:     Musculoskeletal: Normal range of motion and neck supple.     Thyroid: No thyromegaly.  Cardiovascular:     Rate and Rhythm: Normal rate and regular rhythm.     Heart sounds: Normal heart sounds.  Pulmonary:     Effort: Pulmonary effort is normal.     Breath sounds: Normal breath sounds.  Abdominal:     General: Bowel sounds are normal. There is no distension.     Palpations: Abdomen is soft.     Tenderness: There is no abdominal tenderness. There is no guarding.  Musculoskeletal:     Right lower leg: No edema.     Left lower leg: No edema.  Lymphadenopathy:     Cervical: No cervical adenopathy.  Skin:    General: Skin is warm and dry.     Findings: No rash.  Neurological:     General: No focal deficit present.     Mental Status: He is alert and oriented to person, place, and time.     Cranial Nerves: No cranial nerve deficit.     Motor: No abnormal muscle tone.  Psychiatric:        Mood and Affect: Mood normal.        Behavior: Behavior normal.     ------------------------------------------------------------------------------------------------------------------------------------------------------------------------------------------------------------------- Assessment and Plan  Well adult exam Well adult  BP elevated today Orders Placed This Encounter  Procedures  . Flu Vaccine QUAD 6+ mos PF IM (Fluarix Quad PF)  . Comp Met (CMET)  . CBC w/Diff  . Lipid panel  . TSH  Screenings:  Lipid Immunizations:  Influenza  Anticipatory guidance/Risk factor reduction:   Recommendations Per AVS.    Essential hypertension -Bp elevated and has been on high on several occasions.  -Recommend low salt diet and will start benicar '10mg'$  daily.  -Return in 6 week for f/u.

## 2019-08-24 NOTE — Assessment & Plan Note (Signed)
-  Bp elevated and has been on high on several occasions.  -Recommend low salt diet and will start benicar 10mg  daily.  -Return in 6 week for f/u.

## 2019-08-24 NOTE — Patient Instructions (Signed)
Preventive Care 19-32 Years Old, Male Preventive care refers to lifestyle choices and visits with your health care provider that can promote health and wellness. This includes:  A yearly physical exam. This is also called an annual well check.  Regular dental and eye exams.  Immunizations.  Screening for certain conditions.  Healthy lifestyle choices, such as eating a healthy diet, getting regular exercise, not using drugs or products that contain nicotine and tobacco, and limiting alcohol use. What can I expect for my preventive care visit? Physical exam Your health care provider will check:  Height and weight. These may be used to calculate body mass index (BMI), which is a measurement that tells if you are at a healthy weight.  Heart rate and blood pressure.  Your skin for abnormal spots. Counseling Your health care provider may ask you questions about:  Alcohol, tobacco, and drug use.  Emotional well-being.  Home and relationship well-being.  Sexual activity.  Eating habits.  Work and work Statistician. What immunizations do I need?  Influenza (flu) vaccine  This is recommended every year. Tetanus, diphtheria, and pertussis (Tdap) vaccine  You may need a Td booster every 10 years. Varicella (chickenpox) vaccine  You may need this vaccine if you have not already been vaccinated. Human papillomavirus (HPV) vaccine  If recommended by your health care provider, you may need three doses over 6 months. Measles, mumps, and rubella (MMR) vaccine  You may need at least one dose of MMR. You may also need a second dose. Meningococcal conjugate (MenACWY) vaccine  One dose is recommended if you are 45-76 years old and a Market researcher living in a residence hall, or if you have one of several medical conditions. You may also need additional booster doses. Pneumococcal conjugate (PCV13) vaccine  You may need this if you have certain conditions and were not  previously vaccinated. Pneumococcal polysaccharide (PPSV23) vaccine  You may need one or two doses if you smoke cigarettes or if you have certain conditions. Hepatitis A vaccine  You may need this if you have certain conditions or if you travel or work in places where you may be exposed to hepatitis A. Hepatitis B vaccine  You may need this if you have certain conditions or if you travel or work in places where you may be exposed to hepatitis B. Haemophilus influenzae type b (Hib) vaccine  You may need this if you have certain risk factors. You may receive vaccines as individual doses or as more than one vaccine together in one shot (combination vaccines). Talk with your health care provider about the risks and benefits of combination vaccines. What tests do I need? Blood tests  Lipid and cholesterol levels. These may be checked every 5 years starting at age 17.  Hepatitis C test.  Hepatitis B test. Screening   Diabetes screening. This is done by checking your blood sugar (glucose) after you have not eaten for a while (fasting).  Sexually transmitted disease (STD) testing. Talk with your health care provider about your test results, treatment options, and if necessary, the need for more tests. Follow these instructions at home: Eating and drinking   Eat a diet that includes fresh fruits and vegetables, whole grains, lean protein, and low-fat dairy products.  Take vitamin and mineral supplements as recommended by your health care provider.  Do not drink alcohol if your health care provider tells you not to drink.  If you drink alcohol: ? Limit how much you have to 0-2  drinks a day. ? Be aware of how much alcohol is in your drink. In the [place] one drink equals one 12 oz bottle of beer (355 mL), one 5 oz glass of wine (148 mL), or one 1 oz glass of hard liquor (44 mL). Lifestyle  Take daily care of your teeth and gums.  Stay active. Exercise for at least 30 minutes on 5 or  more days each week.  Do not use any products that contain nicotine or tobacco, such as cigarettes, e-cigarettes, and chewing tobacco. If you need help quitting, ask your health care provider.  If you are sexually active, practice safe sex. Use a condom or other form of protection to prevent STIs (sexually transmitted infections). What's next?  Go to your health care provider once a year for a well check visit.  Ask your health care provider how often you should have your eyes and teeth checked.  Stay up to date on all vaccines. This information is not intended to replace advice given to you by your health care provider. Make sure you discuss any questions you have with your health care provider. Document Released: 02/04/2002 Document Revised: 12/03/2018 Document Reviewed: 12/03/2018 Elsevier Patient Education  2020 Elsevier Inc.  

## 2019-08-24 NOTE — Assessment & Plan Note (Signed)
Well adult  BP elevated today Orders Placed This Encounter  Procedures  . Flu Vaccine QUAD 6+ mos PF IM (Fluarix Quad PF)  . Comp Met (CMET)  . CBC w/Diff  . Lipid panel  . TSH  Screenings:  Lipid Immunizations:  Influenza  Anticipatory guidance/Risk factor reduction:  Recommendations Per AVS.

## 2019-08-27 ENCOUNTER — Other Ambulatory Visit: Payer: Self-pay

## 2019-08-27 DIAGNOSIS — Z13220 Encounter for screening for lipoid disorders: Secondary | ICD-10-CM

## 2019-08-27 DIAGNOSIS — Z0001 Encounter for general adult medical examination with abnormal findings: Secondary | ICD-10-CM

## 2019-08-27 NOTE — Progress Notes (Signed)
-  Bilirubin and liver enzymes are elevated.  If consuming EtOH regularly, I would recommend he reduce intake.  Also please ask if he is taking any herbal supplements.  I would like to recheck a hepatic panel in 4 weeks (please enter orders and schedule lab visit)  -Cholesterol elevated.  Recommend low fat diet with regular exercise to improve this.  He does not need medication at this time.    Thanks!

## 2019-09-24 ENCOUNTER — Other Ambulatory Visit: Payer: 59

## 2019-10-06 ENCOUNTER — Other Ambulatory Visit: Payer: 59

## 2020-05-29 ENCOUNTER — Ambulatory Visit: Payer: 59 | Admitting: Nurse Practitioner

## 2020-05-29 ENCOUNTER — Other Ambulatory Visit: Payer: Self-pay

## 2020-05-30 ENCOUNTER — Ambulatory Visit: Payer: Self-pay

## 2020-05-30 ENCOUNTER — Ambulatory Visit (HOSPITAL_BASED_OUTPATIENT_CLINIC_OR_DEPARTMENT_OTHER)
Admission: RE | Admit: 2020-05-30 | Discharge: 2020-05-30 | Disposition: A | Discharge status: Home / Self Care | Payer: No Typology Code available for payment source | Source: Ambulatory Visit | Attending: Family Medicine | Admitting: Family Medicine

## 2020-05-30 ENCOUNTER — Encounter: Payer: Self-pay | Admitting: Nurse Practitioner

## 2020-05-30 ENCOUNTER — Ambulatory Visit: Payer: No Typology Code available for payment source | Admitting: Nurse Practitioner

## 2020-05-30 ENCOUNTER — Ambulatory Visit: Payer: No Typology Code available for payment source | Admitting: Family Medicine

## 2020-05-30 ENCOUNTER — Encounter: Payer: Self-pay | Admitting: Family Medicine

## 2020-05-30 VITALS — BP 150/100 | HR 67 | Temp 97.6°F | Ht 70.0 in | Wt 176.6 lb

## 2020-05-30 VITALS — BP 180/112 | HR 73 | Ht 70.0 in | Wt 176.0 lb

## 2020-05-30 DIAGNOSIS — S93492A Sprain of other ligament of left ankle, initial encounter: Secondary | ICD-10-CM

## 2020-05-30 DIAGNOSIS — S92155A Nondisplaced avulsion fracture (chip fracture) of left talus, initial encounter for closed fracture: Secondary | ICD-10-CM

## 2020-05-30 DIAGNOSIS — J4520 Mild intermittent asthma, uncomplicated: Secondary | ICD-10-CM

## 2020-05-30 DIAGNOSIS — M79672 Pain in left foot: Secondary | ICD-10-CM | POA: Insufficient documentation

## 2020-05-30 DIAGNOSIS — I10 Essential (primary) hypertension: Secondary | ICD-10-CM | POA: Diagnosis not present

## 2020-05-30 MED ORDER — IBUPROFEN 600 MG PO TABS: 600.0000 mg | ORAL_TABLET | Freq: Three times a day (TID) | ORAL | 0 refills | Status: DC | PRN

## 2020-05-30 MED ORDER — OLMESARTAN MEDOXOMIL 20 MG PO TABS: 10.0000 mg | ORAL_TABLET | Freq: Every day | ORAL | 3 refills | Status: DC

## 2020-05-30 MED ORDER — ALBUTEROL SULFATE HFA 108 (90 BASE) MCG/ACT IN AERS: 1.0000 | INHALATION_SPRAY | Freq: Four times a day (QID) | RESPIRATORY_TRACT | 1 refills | Status: DC | PRN

## 2020-05-30 NOTE — Progress Notes (Signed)
Noah Romero - 34 y.o. male MRN [id number]  Date of birth: [birth year]  SUBJECTIVE:  Including CC & ROS.  Chief Complaint  Patient presents with  . Ankle Injury    left x 05/28/2020    [name] is a 34 y.o. male that is presenting with left foot pain.  He had an injury last Thursday where he was turning and felt a pop in his foot.  Since that time he has had pain over the dorsum of the foot as well as significant swelling and ecchymosis.  No history of similar pain.  There is no inversion injury.  He denies any numbness or tingling.   Review of Systems See HPI   HISTORY: Past Medical, Surgical, Social, and Family History Reviewed & Updated per EMR.   Pertinent Historical Findings include:  Past Medical History:  Diagnosis Date  . Allergy   . Asthma     Past Surgical History:  Procedure Laterality Date  . NO PAST SURGERIES      Family History  Problem Relation Age of Onset  . Diabetes Mother   . Hyperlipidemia Father   . Hypertension Father   . Birth defects Paternal Grandfather   . Prostate cancer Other        GF dx in his 70  . CAD Other        GF  . Colon cancer Neg Hx     Social History   Socioeconomic History  . Marital status: Single    Spouse name: Not on file  . Number of children: 0  . Years of education: Not on file  . Highest education level: Not on file  Occupational History  . Occupation: Leisure centre manager , Heritage manager: [employer]  Tobacco Use  . Smoking status: Never Smoker  . Smokeless tobacco: Never Used  Substance and Sexual Activity  . Alcohol use: Yes    Comment: socially  . Drug use: No  . Sexual activity: Not on file  Other Topics Concern  . Not on file  Social History Narrative   ** Merged History Encounter **       Attends college         Social Determinants of Health   Financial Resource Strain:   . Difficulty of Paying Living Expenses:   Food Insecurity:   . Worried About Programme researcher, broadcasting/film/video in the Last Year:   .  Barista in the Last Year:   Transportation Needs:   . Freight forwarder (Medical):   Marland Kitchen Lack of Transportation (Non-Medical):   Physical Activity:   . Days of Exercise per Week:   . Minutes of Exercise per Session:   Stress:   . Feeling of Stress :   Social Connections:   . Frequency of Communication with Friends and Family:   . Frequency of Social Gatherings with Friends and Family:   . Attends Religious Services:   . Active Member of Clubs or Organizations:   . Attends Banker Meetings:   Marland Kitchen Marital Status:   Intimate Partner Violence:   . Fear of Current or Ex-Partner:   . Emotionally Abused:   Marland Kitchen Physically Abused:   . Sexually Abused:      PHYSICAL EXAM:  VS: BP (!) 180/112   Pulse 73   Ht 5\' 10"  (1.778 m)   Wt 176 lb (79.8 kg)   BMI 25.25 kg/m  Physical Exam Gen: NAD, alert, cooperative with exam, well-appearing  MSK:  Left foot: Significant ecchymosis and swelling over the dorsum and lateral aspect of the foot. Limited plantar flexion dorsiflexion. Tenderness to palpation over the dorsum midfoot. Neurovascularly intact  Limited ultrasound: Left foot/ankle:  Normal-appearing medial malleolus. Normal-appearing posterior tibialis. Soft tissue swelling appreciated in the dorsum of the midfoot and hindfoot. No effusion in the ankle joint. Normal-appearing peroneal tendons. Normal insertion of the peroneal brevis into the base of the fifth. There appears to be a change over the talus to suggest an avulsion fracture with increased hyperemia.  Summary: Findings would suggest a fracture of the talus  Ultrasound and interpretation by Clearance Coots, MD    ASSESSMENT & PLAN:   Closed nondisplaced avulsion fracture of left talus Injury occurred on 6/3.  Independent review of the x-ray demonstrates a dorsal avulsion fracture of the talus. -Counseled on supportive care. -Cam walker. -Follow-up in 4 weeks.  Would reimage at that  time.

## 2020-05-30 NOTE — Patient Instructions (Signed)
Nice to meet you Please try ice  Please try to elevate Please use the boot  Please send me a message in MyChart with any questions or updates.  Please see me back in 4 weeks.   --Dr. Jordan Likes

## 2020-05-30 NOTE — Patient Instructions (Addendum)
Resume olmesartan for HTN Monitor BP at home (2-3x/day) in AM Maintain DASH diet Avoid ETOH intake or minimize intake.  You will be contacted to schedule appt with sports medicine Use ibuprofen for pain Continue cold compress and elevation.  Ankle Sprain  An ankle sprain is a stretch or tear in one of the tough tissues (ligaments) that connect the bones in your ankle. An ankle sprain can happen when the ankle rolls outward (inversion sprain) or inward (eversion sprain). What are the causes? This condition is caused by rolling or twisting the ankle. What increases the risk? You are more likely to develop this condition if you play sports. What are the signs or symptoms? Symptoms of this condition include:  Pain in your ankle.  Swelling.  Bruising. This may happen right after you sprain your ankle or 1-2 days later.  Trouble standing or walking. How is this diagnosed? This condition is diagnosed with:  A physical exam. During the exam, your doctor will press on certain parts of your foot and ankle and try to move them in certain ways.  X-ray imaging. These may be taken to see how bad the sprain is and to check for broken bones. How is this treated? This condition may be treated with:  A brace or splint. This is used to keep the ankle from moving until it heals.  An elastic bandage. This is used to support the ankle.  Crutches.  Pain medicine.  Surgery. This may be needed if the sprain is very bad.  Physical therapy. This may help to improve movement in the ankle. Follow these instructions at home: If you have a brace or a splint:  Wear the brace or splint as told by your doctor. Remove it only as told by your doctor.  Loosen the brace or splint if your toes: ? Tingle. ? Lose feeling (become numb). ? Turn cold and blue.  Keep the brace or splint clean.  If the brace or splint is not waterproof: ? Do not let it get wet. ? Cover it with a watertight covering  when you take a bath or a shower. If you have an elastic bandage (dressing):  Remove it to shower or bathe.  Try not to move your ankle much, but wiggle your toes from time to time. This helps to prevent swelling.  Adjust the dressing if it feels too tight.  Loosen the dressing if your foot: ? Loses feeling. ? Tingles. ? Becomes cold and blue. Managing pain, stiffness, and swelling   Take over-the-counter and prescription medicines only as told by doctor.  For 2-3 days, keep your ankle raised (elevated) above the level of your heart.  If told, put ice on the injured area: ? If you have a removable brace or splint, remove it as told by your doctor. ? Put ice in a plastic bag. ? Place a towel between your skin and the bag. ? Leave the ice on for 20 minutes, 2-3 times a day. General instructions  Rest your ankle.  Do not use your injured leg to support your body weight until your doctor says that you can. Use crutches as told by your doctor.  Do not use any products that contain nicotine or tobacco, such as cigarettes, e-cigarettes, and chewing tobacco. If you need help quitting, ask your doctor.  Keep all follow-up visits as told by your doctor. Contact a doctor if:  Your bruises or swelling are quickly getting worse.  Your pain does not get better  after you take medicine. Get help right away if:  You cannot feel your toes or foot.  Your foot or toes look blue.  You have very bad pain that gets worse. Summary  An ankle sprain is a stretch or tear in one of the tough tissues (ligaments) that connect the bones in your ankle.  This condition is caused by rolling or twisting the ankle.  Symptoms include pain, swelling, bruising, and trouble walking.  To help with pain and swelling, put ice on the injured ankle, raise your ankle above the level of your heart, and use an elastic bandage. Also, rest as told by your doctor.  Keep all follow-up visits as told by your  doctor. This is important. This information is not intended to replace advice given to you by your health care provider. Make sure you discuss any questions you have with your health care provider. Document Revised: 05/05/2018 Document Reviewed: 05/05/2018 Elsevier Patient Education  [place] DASH stands for "Dietary Approaches to Stop Hypertension." The DASH eating plan is a healthy eating plan that has been shown to reduce high blood pressure (hypertension). It may also reduce your risk for type 2 diabetes, heart disease, and stroke. The DASH eating plan may also help with weight loss. What are tips for following this plan?  General guidelines  Avoid eating more than 2,300 mg (milligrams) of salt (sodium) a day. If you have hypertension, you may need to reduce your sodium intake to 1,500 mg a day.  Limit alcohol intake to no more than 1 drink a day for nonpregnant women and 2 drinks a day for men. One drink equals 12 oz of beer, 5 oz of wine, or 1 oz of hard liquor.  Work with your health care provider to maintain a healthy body weight or to lose weight. Ask what an ideal weight is for you.  Get at least 30 minutes of exercise that causes your heart to beat faster (aerobic exercise) most days of the week. Activities may include walking, swimming, or biking.  Work with your health care provider or diet and nutrition specialist (dietitian) to adjust your eating plan to your individual calorie needs. Reading food labels   Check food labels for the amount of sodium per serving. Choose foods with less than 5 percent of the Daily Value of sodium. Generally, foods with less than 300 mg of sodium per serving fit into this eating plan.  To find whole grains, look for the word "whole" as the first word in the ingredient list. Shopping  Buy products labeled as "low-sodium" or "no salt added."  Buy fresh foods. Avoid canned foods and premade or frozen  meals. Cooking  Avoid adding salt when cooking. Use salt-free seasonings or herbs instead of table salt or sea salt. Check with your health care provider or pharmacist before using salt substitutes.  Do not fry foods. Cook foods using healthy methods such as baking, boiling, grilling, and broiling instead.  Cook with heart-healthy oils, such as olive, canola, soybean, or sunflower oil. Meal planning  Eat a balanced diet that includes: ? 5 or more servings of fruits and vegetables each day. At each meal, try to fill half of your plate with fruits and vegetables. ? Up to 6-8 servings of whole grains each day. ? Less than 6 oz of lean meat, poultry, or fish each day. A 3-oz serving of meat is about the same size as a deck of cards. One  egg equals 1 oz. ? 2 servings of low-fat dairy each day. ? A serving of nuts, seeds, or beans 5 times each week. ? Heart-healthy fats. Healthy fats called Omega-3 fatty acids are found in foods such as flaxseeds and coldwater fish, like sardines, salmon, and mackerel.  Limit how much you eat of the following: ? Canned or prepackaged foods. ? Food that is high in trans fat, such as fried foods. ? Food that is high in saturated fat, such as fatty meat. ? Sweets, desserts, sugary drinks, and other foods with added sugar. ? Full-fat dairy products.  Do not salt foods before eating.  Try to eat at least 2 vegetarian meals each week.  Eat more home-cooked food and less restaurant, buffet, and fast food.  When eating at a restaurant, ask that your food be prepared with less salt or no salt, if possible. What foods are recommended? The items listed may not be a complete list. Talk with your dietitian about what dietary choices are best for you. Grains Whole-grain or whole-wheat bread. Whole-grain or whole-wheat pasta. Brown rice. Orpah Cobb. Bulgur. Whole-grain and low-sodium cereals. Pita bread. Low-fat, low-sodium crackers. Whole-wheat flour  tortillas. Vegetables Fresh or frozen vegetables (raw, steamed, roasted, or grilled). Low-sodium or reduced-sodium tomato and vegetable juice. Low-sodium or reduced-sodium tomato sauce and tomato paste. Low-sodium or reduced-sodium canned vegetables. Fruits All fresh, dried, or frozen fruit. Canned fruit in natural juice (without added sugar). Meat and other protein foods Skinless chicken or [place]. Ground chicken or [place]. Pork with fat trimmed off. Fish and seafood. Egg whites. Dried beans, peas, or lentils. Unsalted nuts, nut butters, and seeds. Unsalted canned beans. Lean cuts of beef with fat trimmed off. Low-sodium, lean deli meat. Dairy Low-fat (1%) or fat-free (skim) milk. Fat-free, low-fat, or reduced-fat cheeses. Nonfat, low-sodium ricotta or cottage cheese. Low-fat or nonfat yogurt. Low-fat, low-sodium cheese. Fats and oils Soft margarine without trans fats. Vegetable oil. Low-fat, reduced-fat, or light mayonnaise and salad dressings (reduced-sodium). Canola, safflower, olive, soybean, and sunflower oils. Avocado. Seasoning and other foods Herbs. Spices. Seasoning mixes without salt. Unsalted popcorn and pretzels. Fat-free sweets. What foods are not recommended? The items listed may not be a complete list. Talk with your dietitian about what dietary choices are best for you. Grains Baked goods made with fat, such as croissants, muffins, or some breads. Dry pasta or rice meal packs. Vegetables Creamed or fried vegetables. Vegetables in a cheese sauce. Regular canned vegetables (not low-sodium or reduced-sodium). Regular canned tomato sauce and paste (not low-sodium or reduced-sodium). Regular tomato and vegetable juice (not low-sodium or reduced-sodium). Rosita Fire. Olives. Fruits Canned fruit in a light or heavy syrup. Fried fruit. Fruit in cream or butter sauce. Meat and other protein foods Fatty cuts of meat. Ribs. Fried meat. Tomasa Blase. Sausage. Bologna and other processed lunch meats.  Salami. Fatback. Hotdogs. Bratwurst. Salted nuts and seeds. Canned beans with added salt. Canned or smoked fish. Whole eggs or egg yolks. Chicken or [place] with skin. Dairy Whole or 2% milk, cream, and half-and-half. Whole or full-fat cream cheese. Whole-fat or sweetened yogurt. Full-fat cheese. Nondairy creamers. Whipped toppings. Processed cheese and cheese spreads. Fats and oils Butter. Stick margarine. Lard. Shortening. Ghee. Bacon fat. Tropical oils, such as coconut, palm kernel, or palm oil. Seasoning and other foods Salted popcorn and pretzels. Onion salt, garlic salt, seasoned salt, table salt, and sea salt. Worcestershire sauce. Tartar sauce. Barbecue sauce. Teriyaki sauce. Soy sauce, including reduced-sodium. Steak sauce. Canned and packaged gravies. Fish  sauce. Oyster sauce. Cocktail sauce. Horseradish that you find on the shelf. Ketchup. Mustard. Meat flavorings and tenderizers. Bouillon cubes. Hot sauce and Tabasco sauce. Premade or packaged marinades. Premade or packaged taco seasonings. Relishes. Regular salad dressings. Where to find more information:  National Heart, Lung, and Blood Institute: PopSteam.is  American Heart Association: www.heart.org Summary  The DASH eating plan is a healthy eating plan that has been shown to reduce high blood pressure (hypertension). It may also reduce your risk for type 2 diabetes, heart disease, and stroke.  With the DASH eating plan, you should limit salt (sodium) intake to 2,300 mg a day. If you have hypertension, you may need to reduce your sodium intake to 1,500 mg a day.  When on the DASH eating plan, aim to eat more fresh fruits and vegetables, whole grains, lean proteins, low-fat dairy, and heart-healthy fats.  Work with your health care provider or diet and nutrition specialist (dietitian) to adjust your eating plan to your individual calorie needs. This information is not intended to replace advice given to you by your health  care provider. Make sure you discuss any questions you have with your health care provider. Document Revised: 11/21/2017 Document Reviewed: 12/02/2016 Elsevier Patient Education  2020 [employer].

## 2020-05-30 NOTE — Progress Notes (Signed)
Subjective:  Patient ID: Noah Romero, male    DOB: 10-04-1986  Age: 34 y.o. MRN: [id number]  CC: Foot Pain (Pt c/o lt foot pain, x 2days.  Pt foot and ankle is swollen, he has been using ice and elevating it.)  Foot Injury  The incident occurred 2 days ago. The incident occurred at work. The injury mechanism was a twisting injury. The pain is present in the left foot and left ankle. The quality of the pain is described as aching. The pain has been constant since onset. Associated symptoms include an inability to bear weight. Pertinent negatives include no loss of motion, loss of sensation, muscle weakness, numbness or tingling. The symptoms are aggravated by movement, palpation and weight bearing. He has tried rest, elevation and ice for the symptoms. The treatment provided mild relief.    HTN: Uncontrolled Discontinued benicar 18months ago Does not check BP at home Maintains a regular diet.  Asthma: Needs albuterol refilled. Use 1-2x/month, triggered by pollen or change in weather. Previous rx expired. Denies any acute symptoms or exacerbation  Reviewed past Medical, Social and Family history today.  Outpatient Medications Prior to Visit  Medication Sig Dispense Refill  . albuterol (PROAIR HFA) 108 (90 Base) MCG/ACT inhaler Inhale 2 puffs into the lungs every 6 (six) hours as needed for wheezing or shortness of breath. 18 g 6  . olmesartan (BENICAR) 20 MG tablet Take 0.5 tablets (10 mg total) by mouth daily. (Patient not taking: Reported on 05/30/2020) 30 tablet 3   No facility-administered medications prior to visit.    ROS See HPI  Objective:  BP (!) 150/100 (BP Location: Right Arm, Patient Position: Sitting, Cuff Size: Normal)   Pulse 67   Temp 97.6 F (36.4 C) (Temporal)   Ht 5\' 10"  (1.778 m)   Wt 176 lb 9.6 oz (80.1 kg)   SpO2 98%   BMI 25.34 kg/m   BP Readings from Last 3 Encounters:  05/30/20 (!) 150/100  08/24/19 (!) 180/114  01/03/16 138/88    Wt Readings from  Last 3 Encounters:  05/30/20 176 lb 9.6 oz (80.1 kg)  08/24/19 182 lb 12.8 oz (82.9 kg)  01/03/16 185 lb (83.9 kg)    Physical Exam Cardiovascular:     Rate and Rhythm: Normal rate and regular rhythm.     Pulses: Normal pulses.     Heart sounds: Normal heart sounds.  Pulmonary:     Effort: Pulmonary effort is normal.     Breath sounds: Normal breath sounds.  Musculoskeletal:        General: Swelling, tenderness and signs of injury present.  Neurological:     Mental Status: He is alert and oriented to person, place, and time.  Psychiatric:        Mood and Affect: Mood normal.        Behavior: Behavior normal.        Thought Content: Thought content normal.    Lab Results  Component Value Date   WBC 6.5 08/24/2019   HGB 14.6 08/24/2019   HCT 43.4 08/24/2019   PLT 319.0 08/24/2019   GLUCOSE 96 08/24/2019   CHOL 231 (H) 08/24/2019   TRIG 111.0 08/24/2019   HDL 62.10 08/24/2019   LDLCALC 147 (H) 08/24/2019   ALT 65 (H) 08/24/2019   AST 52 (H) 08/24/2019   NA 135 08/24/2019   K 4.9 08/24/2019   CL 100 08/24/2019   CREATININE 0.84 08/24/2019   BUN 18 08/24/2019   CO2  27 08/24/2019   TSH 2.49 08/24/2019   INR 1.05 01/13/2015   Assessment & Plan:  This visit occurred during the SARS-CoV-2 public health emergency.  Safety protocols were in place, including screening questions prior to the visit, additional usage of staff PPE, and extensive cleaning of exam room while observing appropriate contact time as indicated for disinfecting solutions.   Noah Romero was seen today for foot pain.  Diagnoses and all orders for this visit:  Essential hypertension -     olmesartan (BENICAR) 20 MG tablet; Take 0.5 tablets (10 mg total) by mouth daily.  Mild intermittent intrinsic asthma without complication -     albuterol (PROAIR HFA) 108 (90 Base) MCG/ACT inhaler; Inhale 1-2 puffs into the lungs every 6 (six) hours as needed for wheezing or shortness of breath.  Sprain of anterior  talofibular ligament of left ankle, initial encounter -     Ambulatory referral to Sports Medicine   I have changed Noah Romero's albuterol. I am also having him maintain his olmesartan.  Meds ordered this encounter  Medications  . olmesartan (BENICAR) 20 MG tablet    Sig: Take 0.5 tablets (10 mg total) by mouth daily.    Dispense:  45 tablet    Refill:  3    Order Specific Question:   Supervising Provider    Answer:   Overton Mam [[id number]]  . albuterol (PROAIR HFA) 108 (90 Base) MCG/ACT inhaler    Sig: Inhale 1-2 puffs into the lungs every 6 (six) hours as needed for wheezing or shortness of breath.    Dispense:  18 g    Refill:  1    Order Specific Question:   Supervising Provider    Answer:   Overton Mam [[id number]]    Problem List Items Addressed This Visit      Cardiovascular and Mediastinum   Essential hypertension - Primary   Relevant Medications   olmesartan (BENICAR) 20 MG tablet    Other Visit Diagnoses    Mild intermittent intrinsic asthma without complication       Relevant Medications   albuterol (PROAIR HFA) 108 (90 Base) MCG/ACT inhaler   Sprain of anterior talofibular ligament of left ankle, initial encounter       Relevant Orders   Ambulatory referral to Sports Medicine      Follow-up: Return in about 4 weeks (around 06/27/2020) for CPE and HTN f/up (F2F, fasting).  Alysia Penna, NP

## 2020-05-30 NOTE — Assessment & Plan Note (Signed)
Injury occurred on 6/3.  Independent review of the x-ray demonstrates a dorsal avulsion fracture of the talus. -Counseled on supportive care. -Cam walker. -Follow-up in 4 weeks.  Would reimage at that time.

## 2020-06-01 ENCOUNTER — Encounter: Payer: Self-pay | Admitting: Nurse Practitioner

## 2020-06-28 ENCOUNTER — Ambulatory Visit: Payer: No Typology Code available for payment source | Admitting: Family Medicine

## 2020-06-29 ENCOUNTER — Ambulatory Visit: Payer: Self-pay | Admitting: Family Medicine

## 2020-07-05 ENCOUNTER — Observation Stay (HOSPITAL_COMMUNITY)
Admission: EM | Admit: 2020-07-05 | Discharge: 2020-07-06 | Disposition: A | Discharge status: Home / Self Care | Payer: BC Managed Care – PPO | Attending: Internal Medicine | Admitting: Internal Medicine

## 2020-07-05 ENCOUNTER — Encounter (HOSPITAL_COMMUNITY): Payer: Self-pay | Admitting: Emergency Medicine

## 2020-07-05 DIAGNOSIS — I160 Hypertensive urgency: Secondary | ICD-10-CM | POA: Diagnosis present

## 2020-07-05 DIAGNOSIS — Z79899 Other long term (current) drug therapy: Secondary | ICD-10-CM | POA: Insufficient documentation

## 2020-07-05 DIAGNOSIS — N179 Acute kidney failure, unspecified: Secondary | ICD-10-CM

## 2020-07-05 DIAGNOSIS — I10 Essential (primary) hypertension: Secondary | ICD-10-CM | POA: Diagnosis present

## 2020-07-05 DIAGNOSIS — R0789 Other chest pain: Secondary | ICD-10-CM

## 2020-07-05 DIAGNOSIS — F10239 Alcohol dependence with withdrawal, unspecified: Secondary | ICD-10-CM | POA: Diagnosis not present

## 2020-07-05 DIAGNOSIS — J45909 Unspecified asthma, uncomplicated: Secondary | ICD-10-CM | POA: Insufficient documentation

## 2020-07-05 DIAGNOSIS — R079 Chest pain, unspecified: Secondary | ICD-10-CM | POA: Diagnosis present

## 2020-07-05 DIAGNOSIS — R778 Other specified abnormalities of plasma proteins: Secondary | ICD-10-CM | POA: Diagnosis not present

## 2020-07-05 DIAGNOSIS — Z20822 Contact with and (suspected) exposure to covid-19: Secondary | ICD-10-CM | POA: Insufficient documentation

## 2020-07-05 DIAGNOSIS — E872 Acidosis: Secondary | ICD-10-CM | POA: Diagnosis not present

## 2020-07-05 DIAGNOSIS — F10939 Alcohol use, unspecified with withdrawal, unspecified: Secondary | ICD-10-CM | POA: Diagnosis present

## 2020-07-05 DIAGNOSIS — N289 Disorder of kidney and ureter, unspecified: Secondary | ICD-10-CM

## 2020-07-05 DIAGNOSIS — E8729 Other acidosis: Secondary | ICD-10-CM | POA: Diagnosis present

## 2020-07-05 DIAGNOSIS — R112 Nausea with vomiting, unspecified: Secondary | ICD-10-CM | POA: Diagnosis present

## 2020-07-05 DIAGNOSIS — R945 Abnormal results of liver function studies: Secondary | ICD-10-CM | POA: Insufficient documentation

## 2020-07-05 DIAGNOSIS — R7989 Other specified abnormal findings of blood chemistry: Secondary | ICD-10-CM | POA: Diagnosis present

## 2020-07-05 LAB — COMPREHENSIVE METABOLIC PANEL
ALT: 45 U/L — ABNORMAL HIGH (ref 0–44)
AST: 43 U/L — ABNORMAL HIGH (ref 15–41)
Albumin: 5.1 g/dL — ABNORMAL HIGH (ref 3.5–5.0)
Alkaline Phosphatase: 69 U/L (ref 38–126)
Anion gap: 27 — ABNORMAL HIGH (ref 5–15)
BUN: 20 mg/dL (ref 6–20)
CO2: 20 mmol/L — ABNORMAL LOW (ref 22–32)
Calcium: 10.1 mg/dL (ref 8.9–10.3)
Chloride: 92 mmol/L — ABNORMAL LOW (ref 98–111)
Creatinine, Ser: 1.47 mg/dL — ABNORMAL HIGH (ref 0.61–1.24)
GFR calc Af Amer: >60 mL/min (ref >60)
GFR calc non Af Amer: >60 mL/min (ref >60)
Glucose, Bld: 154 mg/dL — ABNORMAL HIGH (ref 70–99)
Potassium: 4.8 mmol/L (ref 3.5–5.1)
Sodium: 139 mmol/L (ref 135–145)
Total Bilirubin: 2.4 mg/dL — ABNORMAL HIGH (ref 0.3–1.2)
Total Protein: 9 g/dL — ABNORMAL HIGH (ref 6.5–8.1)

## 2020-07-05 LAB — CBC
HCT: 44.7 % (ref 39.0–52.0)
Hemoglobin: 14.9 g/dL (ref 13.0–17.0)
MCH: 30.9 pg (ref 26.0–34.0)
MCHC: 33.3 g/dL (ref 30.0–36.0)
MCV: 92.7 fL (ref 80.0–100.0)
Platelets: 406 10*3/uL — ABNORMAL HIGH (ref 150–400)
RBC: 4.82 MIL/uL (ref 4.22–5.81)
RDW: 12.7 % (ref 11.5–15.5)
WBC: 11.6 10*3/uL — ABNORMAL HIGH (ref 4.0–10.5)
nRBC: 0 % (ref 0.0–0.2)

## 2020-07-05 LAB — URINALYSIS, ROUTINE W REFLEX MICROSCOPIC
Bacteria, UA: NONE SEEN
Bilirubin Urine: NEGATIVE
Glucose, UA: NEGATIVE mg/dL
Ketones, ur: 80 mg/dL — AB
Leukocytes,Ua: NEGATIVE
Nitrite: NEGATIVE
Protein, ur: 100 mg/dL — AB
Specific Gravity, Urine: 1.023 (ref 1.005–1.030)
pH: 5 (ref 5.0–8.0)

## 2020-07-05 LAB — ETHANOL: Alcohol, Ethyl (B): <10 mg/dL (ref <10)

## 2020-07-05 LAB — LIPASE, BLOOD: Lipase: 22 U/L (ref 11–51)

## 2020-07-05 LAB — TROPONIN I (HIGH SENSITIVITY)
Troponin I (High Sensitivity): 39 ng/L — ABNORMAL HIGH (ref <18)
Troponin I (High Sensitivity): 76 ng/L — ABNORMAL HIGH (ref <18)

## 2020-07-05 LAB — CBG MONITORING, ED: Glucose-Capillary: 145 mg/dL — ABNORMAL HIGH (ref 70–99)

## 2020-07-05 MED ORDER — LORAZEPAM 1 MG PO TABS
1.0000 mg | ORAL_TABLET | Freq: Once | ORAL | Status: AC
  Administered 2020-07-05: 1 mg via SUBLINGUAL
  Filled 2020-07-05: qty 1

## 2020-07-05 MED ORDER — LACTATED RINGERS IV BOLUS
1000.0000 mL | Freq: Once | INTRAVENOUS | Status: AC
  Administered 2020-07-05: 1000 mL via INTRAVENOUS

## 2020-07-05 MED ORDER — SODIUM CHLORIDE 0.9% FLUSH: 3.0000 mL | Freq: Once | INTRAVENOUS | Status: DC

## 2020-07-05 MED ORDER — ONDANSETRON 4 MG PO TBDP
4.0000 mg | ORAL_TABLET | Freq: Once | ORAL | Status: AC
  Administered 2020-07-05: 4 mg via ORAL
  Filled 2020-07-05: qty 1

## 2020-07-05 NOTE — ED Triage Notes (Signed)
Patient in POV. Presents with multiple complaints. States he woke up this AM with N/V, has been unable to tolerate any food/fluids. Also reports fatigue and weakness. Appears highly anxious in triage.

## 2020-07-05 NOTE — ED Notes (Signed)
In triage,

## 2020-07-05 NOTE — ED Provider Notes (Addendum)
Patient placed in Quick Look pathway, seen and evaluated   Chief Complaint: Emesis  HPI:   N/V beginning this morning. Several episodes.  Denies abdominal pain, fever, diarrhea.  ROS: N/V (one)  Physical Exam:   Gen: No distress  Neuro: Awake and Alert  Skin: Warm    Focused Exam:   No abdominal tenderness, No CVA tenderness   Initiation of care has begun. The patient has been counseled on the process, plan, and necessity for staying for the completion/evaluation, and the remainder of the medical screening examination   Concepcion Living 07/05/20 1659    Melene Plan, DO 07/05/20 1819    6:13 PM Med Tech said she was worried about how patient looked in lobby. Patient back in triage now complaining of chest pain.  Patient continues to be tachycardic. No diaphoresis, pallor. Still A&Ox4. Sitting upright. Abdomen still nontender. Now states he drinks most days out of the week. Last alcohol was last night.  [place], hypertension, N/V could be due to alcohol withdrawal today or symptoms could be due to excessive alcohol last night.    Anselm Pancoast, PA-C 07/05/20 1824    Melene Plan, DO 07/05/20 2325

## 2020-07-05 NOTE — ED Notes (Signed)
Dr Jeraldine Loots aware Troponin 79

## 2020-07-06 ENCOUNTER — Encounter (HOSPITAL_COMMUNITY): Payer: Self-pay | Admitting: Family Medicine

## 2020-07-06 ENCOUNTER — Observation Stay (HOSPITAL_COMMUNITY): Payer: BC Managed Care – PPO

## 2020-07-06 ENCOUNTER — Observation Stay (HOSPITAL_BASED_OUTPATIENT_CLINIC_OR_DEPARTMENT_OTHER): Payer: BC Managed Care – PPO

## 2020-07-06 DIAGNOSIS — R7989 Other specified abnormal findings of blood chemistry: Secondary | ICD-10-CM | POA: Diagnosis not present

## 2020-07-06 DIAGNOSIS — F10230 Alcohol dependence with withdrawal, uncomplicated: Secondary | ICD-10-CM

## 2020-07-06 DIAGNOSIS — N289 Disorder of kidney and ureter, unspecified: Secondary | ICD-10-CM

## 2020-07-06 DIAGNOSIS — R079 Chest pain, unspecified: Secondary | ICD-10-CM | POA: Diagnosis not present

## 2020-07-06 DIAGNOSIS — I160 Hypertensive urgency: Secondary | ICD-10-CM | POA: Diagnosis present

## 2020-07-06 DIAGNOSIS — N179 Acute kidney failure, unspecified: Secondary | ICD-10-CM

## 2020-07-06 DIAGNOSIS — R778 Other specified abnormalities of plasma proteins: Secondary | ICD-10-CM | POA: Diagnosis present

## 2020-07-06 DIAGNOSIS — E872 Acidosis: Principal | ICD-10-CM | POA: Diagnosis present

## 2020-07-06 DIAGNOSIS — F10239 Alcohol dependence with withdrawal, unspecified: Secondary | ICD-10-CM | POA: Diagnosis present

## 2020-07-06 DIAGNOSIS — F10939 Alcohol use, unspecified with withdrawal, unspecified: Secondary | ICD-10-CM | POA: Diagnosis present

## 2020-07-06 DIAGNOSIS — E8729 Other acidosis: Secondary | ICD-10-CM | POA: Diagnosis present

## 2020-07-06 HISTORY — DX: Other specified abnormal findings of blood chemistry: R79.89

## 2020-07-06 LAB — COMPREHENSIVE METABOLIC PANEL
ALT: 29 U/L (ref 0–44)
AST: 28 U/L (ref 15–41)
Albumin: 3.7 g/dL (ref 3.5–5.0)
Alkaline Phosphatase: 45 U/L (ref 38–126)
Anion gap: 11 (ref 5–15)
BUN: 23 mg/dL — ABNORMAL HIGH (ref 6–20)
CO2: 28 mmol/L (ref 22–32)
Calcium: 8.6 mg/dL — ABNORMAL LOW (ref 8.9–10.3)
Chloride: 101 mmol/L (ref 98–111)
Creatinine, Ser: 1.14 mg/dL (ref 0.61–1.24)
GFR calc Af Amer: >60 mL/min (ref >60)
GFR calc non Af Amer: >60 mL/min (ref >60)
Glucose, Bld: 126 mg/dL — ABNORMAL HIGH (ref 70–99)
Potassium: 4 mmol/L (ref 3.5–5.1)
Sodium: 140 mmol/L (ref 135–145)
Total Bilirubin: 2.1 mg/dL — ABNORMAL HIGH (ref 0.3–1.2)
Total Protein: 6.7 g/dL (ref 6.5–8.1)

## 2020-07-06 LAB — SARS CORONAVIRUS 2 BY RT PCR (HOSPITAL ORDER, PERFORMED IN ~~LOC~~ HOSPITAL LAB): SARS Coronavirus 2: NEGATIVE

## 2020-07-06 LAB — ECHOCARDIOGRAM COMPLETE
Area-P 1/2: 3.6 cm2
Height: 70 in
S' Lateral: 3.3 cm
Weight: 2720 oz

## 2020-07-06 LAB — MAGNESIUM
Magnesium: 1.9 mg/dL (ref 1.7–2.4)
Magnesium: 1.9 mg/dL (ref 1.7–2.4)

## 2020-07-06 LAB — RAPID URINE DRUG SCREEN, HOSP PERFORMED
Amphetamines: NOT DETECTED
Barbiturates: NOT DETECTED
Benzodiazepines: NOT DETECTED
Cocaine: NOT DETECTED
Opiates: NOT DETECTED
Tetrahydrocannabinol: POSITIVE — AB

## 2020-07-06 LAB — HEPATITIS PANEL, ACUTE
HCV Ab: NONREACTIVE
Hep A IgM: NONREACTIVE
Hep B C IgM: NONREACTIVE
Hepatitis B Surface Ag: NONREACTIVE

## 2020-07-06 LAB — CBC
HCT: 36.7 % — ABNORMAL LOW (ref 39.0–52.0)
Hemoglobin: 12.3 g/dL — ABNORMAL LOW (ref 13.0–17.0)
MCH: 31 pg (ref 26.0–34.0)
MCHC: 33.5 g/dL (ref 30.0–36.0)
MCV: 92.4 fL (ref 80.0–100.0)
Platelets: 270 10*3/uL (ref 150–400)
RBC: 3.97 MIL/uL — ABNORMAL LOW (ref 4.22–5.81)
RDW: 12.9 % (ref 11.5–15.5)
WBC: 8.5 10*3/uL (ref 4.0–10.5)
nRBC: 0 % (ref 0.0–0.2)

## 2020-07-06 LAB — TROPONIN I (HIGH SENSITIVITY): Troponin I (High Sensitivity): 76 ng/L — ABNORMAL HIGH (ref <18)

## 2020-07-06 LAB — D-DIMER, QUANTITATIVE: D-Dimer, Quant: 0.31 ug/mL-FEU (ref 0.00–0.50)

## 2020-07-06 LAB — HIV ANTIBODY (ROUTINE TESTING W REFLEX): HIV Screen 4th Generation wRfx: NONREACTIVE

## 2020-07-06 MED ORDER — SODIUM CHLORIDE 0.9% FLUSH: 3.0000 mL | INTRAVENOUS | Status: DC | PRN

## 2020-07-06 MED ORDER — FOLIC ACID 1 MG PO TABS
1.0000 mg | ORAL_TABLET | Freq: Every day | ORAL | Status: DC
  Administered 2020-07-06: 1 mg via ORAL
  Filled 2020-07-06: qty 1

## 2020-07-06 MED ORDER — THIAMINE HCL 100 MG/ML IJ SOLN: 100.0000 mg | Freq: Every day | INTRAMUSCULAR | Status: DC

## 2020-07-06 MED ORDER — LORAZEPAM 2 MG/ML IJ SOLN: 0.0000 mg | Freq: Two times a day (BID) | INTRAMUSCULAR | Status: DC

## 2020-07-06 MED ORDER — SODIUM CHLORIDE 0.9 % IV SOLN: 250.0000 mL | INTRAVENOUS | Status: DC | PRN

## 2020-07-06 MED ORDER — ONDANSETRON HCL 4 MG/2ML IJ SOLN: 4.0000 mg | Freq: Four times a day (QID) | INTRAMUSCULAR | Status: DC | PRN

## 2020-07-06 MED ORDER — SODIUM CHLORIDE 0.9 % IV BOLUS (SEPSIS)
1000.0000 mL | Freq: Once | INTRAVENOUS | Status: AC
  Administered 2020-07-06: 1000 mL via INTRAVENOUS

## 2020-07-06 MED ORDER — THIAMINE HCL 100 MG PO TABS
100.0000 mg | ORAL_TABLET | Freq: Every day | ORAL | Status: DC
  Administered 2020-07-06: 100 mg via ORAL
  Filled 2020-07-06: qty 1

## 2020-07-06 MED ORDER — ACETAMINOPHEN 650 MG RE SUPP: 650.0000 mg | Freq: Four times a day (QID) | RECTAL | Status: DC | PRN

## 2020-07-06 MED ORDER — ACETAMINOPHEN 325 MG PO TABS: 650.0000 mg | ORAL_TABLET | Freq: Four times a day (QID) | ORAL | Status: DC | PRN

## 2020-07-06 MED ORDER — ADULT MULTIVITAMIN W/MINERALS CH
1.0000 | ORAL_TABLET | Freq: Every day | ORAL | Status: DC
  Administered 2020-07-06: 1 via ORAL
  Filled 2020-07-06: qty 1

## 2020-07-06 MED ORDER — THIAMINE HCL 100 MG/ML IJ SOLN
100.0000 mg | Freq: Once | INTRAMUSCULAR | Status: AC
  Administered 2020-07-06: 100 mg via INTRAVENOUS
  Filled 2020-07-06: qty 2

## 2020-07-06 MED ORDER — LORAZEPAM 2 MG/ML IJ SOLN: 0.0000 mg | INTRAMUSCULAR | Status: DC

## 2020-07-06 MED ORDER — ASPIRIN 81 MG PO CHEW
324.0000 mg | CHEWABLE_TABLET | Freq: Once | ORAL | Status: AC
  Administered 2020-07-06: 324 mg via ORAL
  Filled 2020-07-06: qty 4

## 2020-07-06 MED ORDER — LORAZEPAM 2 MG/ML IJ SOLN: 0.0000 mg | Freq: Four times a day (QID) | INTRAMUSCULAR | Status: DC

## 2020-07-06 MED ORDER — PANTOPRAZOLE SODIUM 40 MG PO TBEC: 40.0000 mg | DELAYED_RELEASE_TABLET | Freq: Every day | ORAL | Status: DC

## 2020-07-06 MED ORDER — LORAZEPAM 1 MG PO TABS: 1.0000 mg | ORAL_TABLET | ORAL | Status: DC | PRN

## 2020-07-06 MED ORDER — LABETALOL HCL 5 MG/ML IV SOLN
10.0000 mg | INTRAVENOUS | Status: DC | PRN
  Administered 2020-07-06: 10 mg via INTRAVENOUS
  Filled 2020-07-06: qty 4

## 2020-07-06 MED ORDER — ONDANSETRON HCL 4 MG PO TABS: 4.0000 mg | ORAL_TABLET | Freq: Four times a day (QID) | ORAL | Status: DC | PRN

## 2020-07-06 MED ORDER — AMLODIPINE BESYLATE 5 MG PO TABS
10.0000 mg | ORAL_TABLET | Freq: Every day | ORAL | Status: DC
  Administered 2020-07-06: 10 mg via ORAL
  Filled 2020-07-06: qty 2

## 2020-07-06 MED ORDER — ENOXAPARIN SODIUM 40 MG/0.4ML ~~LOC~~ SOLN
40.0000 mg | SUBCUTANEOUS | Status: DC
  Administered 2020-07-06: 40 mg via SUBCUTANEOUS

## 2020-07-06 MED ORDER — LORAZEPAM 2 MG/ML IJ SOLN: 0.0000 mg | Freq: Three times a day (TID) | INTRAMUSCULAR | Status: DC

## 2020-07-06 MED ORDER — THIAMINE HCL 100 MG PO TABS: 100.0000 mg | ORAL_TABLET | Freq: Every day | ORAL | Status: DC

## 2020-07-06 MED ORDER — AMLODIPINE BESYLATE 10 MG PO TABS: 10.0000 mg | ORAL_TABLET | Freq: Every day | ORAL | 0 refills | Status: DC

## 2020-07-06 MED ORDER — SODIUM CHLORIDE 0.9% FLUSH: 3.0000 mL | Freq: Two times a day (BID) | INTRAVENOUS | Status: DC

## 2020-07-06 MED ORDER — LORAZEPAM 1 MG PO TABS: 0.0000 mg | ORAL_TABLET | Freq: Two times a day (BID) | ORAL | Status: DC

## 2020-07-06 MED ORDER — LORAZEPAM 2 MG/ML IJ SOLN: 1.0000 mg | INTRAMUSCULAR | Status: DC | PRN

## 2020-07-06 MED ORDER — LORAZEPAM 1 MG PO TABS: 0.0000 mg | ORAL_TABLET | Freq: Four times a day (QID) | ORAL | Status: DC

## 2020-07-06 MED ORDER — FAMOTIDINE IN NACL 20-0.9 MG/50ML-% IV SOLN
20.0000 mg | Freq: Two times a day (BID) | INTRAVENOUS | Status: DC
  Administered 2020-07-06 (×2): 20 mg via INTRAVENOUS
  Filled 2020-07-06 (×2): qty 50

## 2020-07-06 MED ORDER — LORAZEPAM 2 MG/ML IJ SOLN
1.0000 mg | Freq: Once | INTRAMUSCULAR | Status: AC
  Administered 2020-07-06: 1 mg via INTRAVENOUS
  Filled 2020-07-06: qty 1

## 2020-07-06 MED ORDER — IRBESARTAN 75 MG PO TABS
75.0000 mg | ORAL_TABLET | Freq: Every day | ORAL | Status: DC
  Filled 2020-07-06: qty 1

## 2020-07-06 MED ORDER — HYDRALAZINE HCL 20 MG/ML IJ SOLN
10.0000 mg | INTRAMUSCULAR | Status: DC | PRN
  Administered 2020-07-06: 10 mg via INTRAVENOUS
  Filled 2020-07-06: qty 1

## 2020-07-06 MED ORDER — ONDANSETRON HCL 4 MG PO TABS: 4.0000 mg | ORAL_TABLET | Freq: Four times a day (QID) | ORAL | 0 refills | Status: DC | PRN

## 2020-07-06 MED ORDER — PANTOPRAZOLE SODIUM 40 MG PO TBEC: 40.0000 mg | DELAYED_RELEASE_TABLET | Freq: Every day | ORAL | 0 refills | Status: DC

## 2020-07-06 NOTE — Discharge Summary (Signed)
Physician Discharge Summary  Noah Romero [id number] DOB: August 16, 1986 DOA: 07/05/2020  PCP: Anne NgNche, Charlotte Lum, NP  Admit date: 07/05/2020 Discharge date: 07/06/2020  Admitted From: home  Disposition:  Home   Recommendations for Outpatient Follow-up and new medication changes:  1. Follow up with [name], [name] NP in 7 days.  2. Patient has been placed on amlodipine 10 mg daily.  3. Continue pantoprazole 40 mg daily.  4. As needed zofran for nausea control.   Home Health: no   Equipment/Devices: no    Discharge Condition: stable  CODE STATUS: full  Diet recommendation: heart healthy   Brief/Interim Summary: Patient admitted to the hospital with a working diagnosis of alcohol withdrawal syndrome, complicated with uncontrolled hypertension/ hypertensive urgency.  34 year old male with a past medical history for asthma and hypertension who presented with nausea, vomiting, generalized malaise and chest pain.  Apparently he woke up about 6:30 in the morning with severe symptoms of nausea and vomiting.  Not able to take anything by mouth.  Patient reported increased alcohol consumption recently after a friend died.  On his initial physical examination he was hypertensive 184/115, heart rate 135, respiratory rate 18, temperature 98.4, oxygen saturation 97%.  His lungs were clear to auscultation bilaterally, heart S1-S2 present, tachycardic, regular, abdomen soft, nondistended, nontender, no lower extremity edema. Sodium 139, potassium 4.8, chloride 92, bicarb 20, glucose 154, BUN 20, creatinine 1.47, lipase 22, AST 43, ALT 45, white count 11.6, hemoglobin 14.9, hematocrit 44.7, platelets 406.  Troponin I 39-76.  Urinalysis specific gravity 1.023.  D dimer 0,31. Alcohol less than 10.  SARS COVID-19 negative.  Tox screen positive for tetrahydrocannabinol. Chest radiograph with no infiltrates. EKG 123 bpm, normal axis, normal intervals, sinus rhythm, small Q waves in lead II, lead III, and  aVF, J-point elevation V1 through V3, small Q waves V4 through V6, negative T waves in inferior leads, positive LVH.  1.  Alcohol withdrawal syndrome.  Patient was placed on as needed lorazepam withdrawal protocol with good toleration.  His nausea, and vomiting improved, his po intake improved.   No noted tremors, agitation or confusion.   2. Hypertensive urgency with elevated troponin. Patient has been resumed on antihypertensive regimen with amlodipine 10 mg daily. Follow blood pressure as outpatient in 7 days.   His EKG has sings of chronic uncontrolled HTN, his troponin elevation has no ischemic pattern 39, 76, 76. Will follow up on echocardiogram.  Patient has been active at home, running a mile with no angina. Acute coronary syndrome ruled out.   3. Intractable nausea and vomiting. Patient will continue taking pantoprazole at home 40 mg, he was advised to avoid THC and alcohol.  Follow as outpatient.   4. AKI. Renal function improved with IV fluids, at discharge his renal function has a cr of 1,14 with K at 4,0 and serum bicarbonate at 28. Mg at 1,9 and Na at 140.   Discharge Diagnoses:  Principal Problem:   Alcohol withdrawal (HCC) Active Problems:   Essential hypertension   Mild renal insufficiency   Chest pain   Elevated troponin   Elevated LFTs   Alcoholic ketoacidosis   Hypertensive urgency    Discharge Instructions   Allergies as of 07/06/2020   No Known Allergies     Medication List    STOP taking these medications   ibuprofen 600 MG tablet Commonly known as: ADVIL   olmesartan 20 MG tablet Commonly known as: Benicar     TAKE these medications  albuterol 108 (90 Base) MCG/ACT inhaler Commonly known as: ProAir HFA Inhale 1-2 puffs into the lungs every 6 (six) hours as needed for wheezing or shortness of breath.   amLODipine 10 MG tablet Commonly known as: NORVASC Take 1 tablet (10 mg total) by mouth daily.   Fish Oil 1000 MG Caps Take 1 capsule by  mouth daily.   loratadine-pseudoephedrine 10-240 MG 24 hr tablet Commonly known as: CLARITIN-D 24-hour Take 1 tablet by mouth daily as needed for allergies.   multivitamin with minerals Tabs tablet Take 1 tablet by mouth daily.   ondansetron 4 MG tablet Commonly known as: ZOFRAN Take 1 tablet (4 mg total) by mouth every 6 (six) hours as needed for nausea.   pantoprazole 40 MG tablet Commonly known as: PROTONIX Take 1 tablet (40 mg total) by mouth daily.   VITAMIN D3 PO Take 1 capsule by mouth daily.       No Known Allergies      Procedures/Studies: DG CHEST PORT 1 VIEW  Result Date: 07/06/2020 CLINICAL DATA:  34 year old male with chest pain and hypertension. EXAM: PORTABLE CHEST 1 VIEW COMPARISON:  Chest CT 01/13/2015 and earlier. FINDINGS: Portable AP semi upright view at 0037 hours. Lung volumes and mediastinal contours are normal. Visualized tracheal air column is within normal limits. Allowing for portable technique the lungs are clear. No pneumothorax. No acute osseous abnormality identified. IMPRESSION: Negative portable chest. Electronically Signed   By: Odessa Fleming M.D.   On: 07/06/2020 00:54   US Abdomen Limited RUQ  Result Date: 07/06/2020 CLINICAL DATA:  34 year old male with abnormal LFTs. EXAM: ULTRASOUND ABDOMEN LIMITED RIGHT UPPER QUADRANT COMPARISON:  CT Chest, Abdomen, and Pelvis today are reported separately. 01/13/2015 FINDINGS: Gallbladder: No gallstones or wall thickening visualized. No sonographic Murphy sign noted by sonographer. Common bile duct: Diameter: 4 mm, normal. Liver: Liver echogenicity within normal limits (image 40). No discrete liver lesion. No intrahepatic biliary ductal dilatation. Portal vein is patent on color Doppler imaging with normal direction of blood flow towards the liver. Other: Normal visible right kidney. No right upper quadrant free fluid. IMPRESSION: Normal right upper quadrant ultrasound. Electronically Signed   By: Odessa Fleming M.D.    On: 07/06/2020 01:40       Subjective: Patient is feeling better, no nausea or vomiting and tolerating po well, no chest pain or dyspnea. No tremors or anxiety.   Discharge Exam: Vitals:   07/06/20 1529 07/06/20 1545  BP: (!) 145/92 (!) 145/99  Pulse: 77 83  Resp: 13 12  Temp:    SpO2: 98% 97%   Vitals:   07/06/20 1430 07/06/20 1459 07/06/20 1529 07/06/20 1545  BP: (!) 165/107 (!) 146/109 (!) 145/92 (!) 145/99  Pulse: (!) 103 97 77 83  Resp: 13 15 13 12   Temp:      TempSrc:      SpO2: 98% 98% 98% 97%  Weight:      Height:        General: Not in pain or dyspnea Neurology: Awake and alert, non focal  E ENT: no pallor, no icterus, oral mucosa moist Cardiovascular: No JVD. S1-S2 present, rhythmic, no gallops, rubs, or murmurs. No lower extremity edema. Pulmonary: positive breath sounds bilaterally, adequate air movement, no wheezing, rhonchi or rales. Gastrointestinal. Abdomen with, no organomegaly, non tender, no rebound or guarding Skin. No rashes Musculoskeletal: no joint deformities   The results of significant diagnostics from this hospitalization (including imaging, microbiology, ancillary and laboratory) are listed below for  reference.     Microbiology: Recent Results (from the past 240 hour(s))  SARS Coronavirus 2 by RT PCR (hospital order, performed in [facility] hospital lab) Nasopharyngeal Nasopharyngeal Swab     Status: None   Collection Time: 07/06/20  1:00 AM   Specimen: Nasopharyngeal Swab  Result Value Ref Range Status   SARS Coronavirus 2 NEGATIVE NEGATIVE Final    Comment: (NOTE) SARS-CoV-2 target nucleic acids are NOT DETECTED.  The SARS-CoV-2 RNA is generally detectable in upper and lower respiratory specimens during the acute phase of infection. The lowest concentration of SARS-CoV-2 viral copies this assay can detect is 250 copies / mL. A negative result does not preclude SARS-CoV-2 infection and should not be used as the sole basis for  treatment or other patient management decisions.  A negative result may occur with improper specimen collection / handling, submission of specimen other than nasopharyngeal swab, presence of viral mutation(s) within the areas targeted by this assay, and inadequate number of viral copies (<250 copies / mL). A negative result must be combined with clinical observations, patient history, and epidemiological information.  Fact Sheet for Patients:   BoilerBrush.com.cy  Fact Sheet for Healthcare Providers: https://pope.com/  This test is not yet approved or  cleared by the [place] FDA and has been authorized for detection and/or diagnosis of SARS-CoV-2 by FDA under an Emergency Use Authorization (EUA).  This EUA will remain in effect (meaning this test can be used) for the duration of the COVID-19 declaration under Section 564(b)(1) of the Act, 21 U.S.C. section 360bbb-3(b)(1), unless the authorization is terminated or revoked sooner.  Performed at [facility], [address], [place], [place] [address]      Labs: BNP (last 3 results) No results for input(s): BNP in the last 8760 hours. Basic Metabolic Panel: Recent Labs  Lab 07/05/20 1650 07/05/20 2100 07/06/20 0356  NA 139  --  140  K 4.8  --  4.0  CL 92*  --  101  CO2 20*  --  28  GLUCOSE 154*  --  126*  BUN 20  --  23*  CREATININE 1.47*  --  1.14  CALCIUM 10.1  --  8.6*  MG  --  1.9 1.9   Liver Function Tests: Recent Labs  Lab 07/05/20 1650 07/06/20 0356  AST 43* 28  ALT 45* 29  ALKPHOS 69 45  BILITOT 2.4* 2.1*  PROT 9.0* 6.7  ALBUMIN 5.1* 3.7   Recent Labs  Lab 07/05/20 1650  LIPASE 22   No results for input(s): AMMONIA in the last 168 hours. CBC: Recent Labs  Lab 07/05/20 1650 07/06/20 0234  WBC 11.6* 8.5  HGB 14.9 12.3*  HCT 44.7 36.7*  MCV 92.7 92.4  PLT 406* 270   Cardiac Enzymes: No results for input(s): CKTOTAL, CKMB,  CKMBINDEX, TROPONINI in the last 168 hours. BNP: Invalid input(s): POCBNP CBG: Recent Labs  Lab 07/05/20 1642  GLUCAP 145*   D-Dimer Recent Labs    07/06/20 0234  DDIMER 0.31   Hgb A1c No results for input(s): HGBA1C in the last 72 hours. Lipid Profile No results for input(s): CHOL, HDL, LDLCALC, TRIG, CHOLHDL, LDLDIRECT in the last 72 hours. Thyroid function studies No results for input(s): TSH, T4TOTAL, T3FREE, THYROIDAB in the last 72 hours.  Invalid input(s): FREET3 Anemia work up No results for input(s): VITAMINB12, FOLATE, FERRITIN, TIBC, IRON, RETICCTPCT in the last 72 hours. Urinalysis    Component Value Date/Time   COLORURINE YELLOW  07/05/2020 2128   APPEARANCEUR HAZY (A) 07/05/2020 2128   LABSPEC 1.023 07/05/2020 2128   PHURINE 5.0 07/05/2020 2128   GLUCOSEU NEGATIVE 07/05/2020 2128   HGBUR MODERATE (A) 07/05/2020 2128   BILIRUBINUR NEGATIVE 07/05/2020 2128   KETONESUR 80 (A) 07/05/2020 2128   PROTEINUR 100 (A) 07/05/2020 2128   UROBILINOGEN 0.2 01/13/2015 0342   NITRITE NEGATIVE 07/05/2020 2128   LEUKOCYTESUR NEGATIVE 07/05/2020 2128   Sepsis Labs Invalid input(s): PROCALCITONIN,  WBC,  LACTICIDVEN Microbiology Recent Results (from the past 240 hour(s))  SARS Coronavirus 2 by RT PCR (hospital order, performed in [facility] hospital lab) Nasopharyngeal Nasopharyngeal Swab     Status: None   Collection Time: 07/06/20  1:00 AM   Specimen: Nasopharyngeal Swab  Result Value Ref Range Status   SARS Coronavirus 2 NEGATIVE NEGATIVE Final    Comment: (NOTE) SARS-CoV-2 target nucleic acids are NOT DETECTED.  The SARS-CoV-2 RNA is generally detectable in upper and lower respiratory specimens during the acute phase of infection. The lowest concentration of SARS-CoV-2 viral copies this assay can detect is 250 copies / mL. A negative result does not preclude SARS-CoV-2 infection and should not be used as the sole basis for treatment or other patient management  decisions.  A negative result may occur with improper specimen collection / handling, submission of specimen other than nasopharyngeal swab, presence of viral mutation(s) within the areas targeted by this assay, and inadequate number of viral copies (<250 copies / mL). A negative result must be combined with clinical observations, patient history, and epidemiological information.  Fact Sheet for Patients:   BoilerBrush.com.cy  Fact Sheet for Healthcare Providers: https://pope.com/  This test is not yet approved or  cleared by the [place] FDA and has been authorized for detection and/or diagnosis of SARS-CoV-2 by FDA under an Emergency Use Authorization (EUA).  This EUA will remain in effect (meaning this test can be used) for the duration of the COVID-19 declaration under Section 564(b)(1) of the Act, 21 U.S.C. section 360bbb-3(b)(1), unless the authorization is terminated or revoked sooner.  Performed at [facility], [address] [place], [place] [address]      Time coordinating discharge: 45 minutes  SIGNED:   Coralie Keens, MD  [facility] 07/06/2020, 2:45 PM

## 2020-07-06 NOTE — H&P (Signed)
History and Physical    [name]:[id number] DOB: 16-Jun-1986 DOA: 07/05/2020  PCP: Anne Ng, NP   Patient coming from: Home   Chief Complaint: N/V, general malaise, chest pain  HPI: Noah Romero is a 34 y.o. male with medical history significant for childhood asthma and hypertension, now presenting to the emergency department for evaluation of nausea, vomiting, general malaise, and chest pain.  Patient reports that he went to bed last night in his usual state of health but woke at approximately 6:30 AM with nausea and nonbloody vomiting.  He was also experiencing a general malaise without abdominal pain, fevers, chills, or diarrhea.  He attempted to drink some Pedialyte and eat crackers but was unable to keep this down.  He went on to develop some pain in his chest, just left of center, without any alleviating or exacerbating factors, and this eventually resolved spontaneously.  He denies experiencing similar symptoms previously.  Reports that he had been drinking much more alcohol than usual recently after a friend died.  His last drink was last night.  He denies any history of alcohol withdrawal.  Patient notes that he broke his left foot 1 to 2 months ago and had been wearing an orthopedic boot until recently.  He denies any leg swelling or tenderness, and denies any history of VTE.  He has not been coughing or short of breath.  ED Course: Upon arrival to the ED, patient is found to be afebrile, saturating well on room air, tachycardic to the 130s, and hypertensive to 100 8115.  EKG features sinus tachycardia with rate 123.  Chemistry panel is notable for creatinine 1.47, slight elevation in transaminases, and total bilirubin of 2.4.  CBC features mild leukocytosis and thrombocytosis.  Troponin is elevated to 39, and then 76 two hours and 20 minutes later.  Patient was given 3 L of IV fluids, IV Ativan, and 3 and 24 mg of aspirin in the ED.  COVID-19 screening test not yet  resulted.  Review of Systems:  All other systems reviewed and apart from HPI, are negative.  Past Medical History:  Diagnosis Date  . Allergy   . Asthma     Past Surgical History:  Procedure Laterality Date  . NO PAST SURGERIES       reports that he has never smoked. He has never used smokeless tobacco. He reports current alcohol use. He reports that he does not use drugs.  No Known Allergies  Family History  Problem Relation Age of Onset  . Diabetes Mother   . Hyperlipidemia Father   . Hypertension Father   . Birth defects Paternal Grandfather   . Prostate cancer Other        GF dx in his 63  . CAD Other        GF  . Colon cancer Neg Hx      Prior to Admission medications   Medication Sig Start Date End Date Taking? Authorizing Provider  albuterol (PROAIR HFA) 108 (90 Base) MCG/ACT inhaler Inhale 1-2 puffs into the lungs every 6 (six) hours as needed for wheezing or shortness of breath. 05/30/20   [name], Bonna Gains, NP  ibuprofen (ADVIL) 600 MG tablet Take 1 tablet (600 mg total) by mouth every 8 (eight) hours as needed. With food 05/30/20   [name], Bonna Gains, NP  olmesartan (BENICAR) 20 MG tablet Take 0.5 tablets (10 mg total) by mouth daily. 05/30/20   [name], Bonna Gains, NP    Physical  Exam: Vitals:   07/05/20 1630 07/05/20 1639 07/05/20 2135  BP: (!) 184/115  (!) 141/114  Pulse: (!) 135  (!) 114  Resp: 18  18  Temp: 98.4 F (36.9 C)  98.6 F (37 C)  TempSrc: Oral  Oral  SpO2: 97%  100%  Weight:  77.1 kg   Height:  5\' 10"  (1.778 m)     Constitutional: NAD, calm  Eyes: PERTLA, lids and conjunctivae normal ENMT: Mucous membranes are moist. Posterior pharynx clear of any exudate or lesions.   Neck: normal, supple, no masses, no thyromegaly Respiratory: no wheezing, no crackles. No accessory muscle use.  Cardiovascular: Rate ~120s and regular. No extremity edema.  Abdomen: No distension, no tenderness, soft. Bowel sounds active.  Musculoskeletal: no  clubbing / cyanosis. No joint deformity upper and lower extremities.   Skin: no significant rashes, lesions, ulcers. Warm, dry, well-perfused. Neurologic: CN 2-12 grossly intact. Sensation intact. Strength 5/5 in all 4 limbs. Resting tremor.  Psychiatric: Alert and oriented to person, place, and situation. Restless but pleasant and cooperative.    Labs and Imaging on Admission: I have personally reviewed following labs and imaging studies  CBC: Recent Labs  Lab 07/05/20 1650  WBC 11.6*  HGB 14.9  HCT 44.7  MCV 92.7  PLT 406*   Basic Metabolic Panel: Recent Labs  Lab 07/05/20 1650  NA 139  K 4.8  CL 92*  CO2 20*  GLUCOSE 154*  BUN 20  CREATININE 1.47*  CALCIUM 10.1   GFR: Estimated Creatinine Clearance: 73.8 mL/min (A) (by C-G formula based on SCr of 1.47 mg/dL (H)). Liver Function Tests: Recent Labs  Lab 07/05/20 1650  AST 43*  ALT 45*  ALKPHOS 69  BILITOT 2.4*  PROT 9.0*  ALBUMIN 5.1*   Recent Labs  Lab 07/05/20 1650  LIPASE 22   No results for input(s): AMMONIA in the last 168 hours. Coagulation Profile: No results for input(s): INR, PROTIME in the last 168 hours. Cardiac Enzymes: No results for input(s): CKTOTAL, CKMB, CKMBINDEX, TROPONINI in the last 168 hours. BNP (last 3 results) No results for input(s): PROBNP in the last 8760 hours. HbA1C: No results for input(s): HGBA1C in the last 72 hours. CBG: Recent Labs  Lab 07/05/20 1642  GLUCAP 145*   Lipid Profile: No results for input(s): CHOL, HDL, LDLCALC, TRIG, CHOLHDL, LDLDIRECT in the last 72 hours. Thyroid Function Tests: No results for input(s): TSH, T4TOTAL, FREET4, T3FREE, THYROIDAB in the last 72 hours. Anemia Panel: No results for input(s): VITAMINB12, FOLATE, FERRITIN, TIBC, IRON, RETICCTPCT in the last 72 hours. Urine analysis:    Component Value Date/Time   COLORURINE YELLOW 07/05/2020 2128   APPEARANCEUR HAZY (A) 07/05/2020 2128   LABSPEC 1.023 07/05/2020 2128   PHURINE 5.0  07/05/2020 2128   GLUCOSEU NEGATIVE 07/05/2020 2128   HGBUR MODERATE (A) 07/05/2020 2128   BILIRUBINUR NEGATIVE 07/05/2020 2128   KETONESUR 80 (A) 07/05/2020 2128   PROTEINUR 100 (A) 07/05/2020 2128   UROBILINOGEN 0.2 01/13/2015 0342   NITRITE NEGATIVE 07/05/2020 2128   LEUKOCYTESUR NEGATIVE 07/05/2020 2128   Sepsis Labs: @LABRCNTIP (procalcitonin:4,lacticidven:4) )No results found for this or any previous visit (from the past 240 hour(s)).   Radiological Exams on Admission: No results found.  EKG: Independently reviewed. Sinus tachycardia, rate 123.   Assessment/Plan   1. Alcohol withdrawal  - Pt acknowledges excessive alcohol use, presents with anxiety and N/V, and is noted to be tachycardic and tremulous  - Continue to monitor with CIWA, treat with  Ativan, supplement vitamins, monitor electrolytes   2. Chest pain; elevated troponin  - Patient complained on chest pain in triage and is found to have troponin elevated to 39 and then 76  - No leg swelling, tachycapnea, or hypoxia to suggest PE but reports breaking his left foot 1-2 months ago and wearing an orthopedic boot until recently  - Check CXR, d-dimer, echocardiogram, repeat EKG, and another troponin level; continue cardiac monitoring   3. Elevated LFTs  - Mild elevation in transaminases and t bili 2.4 noted, up slightly from one year ago  - He has N/V but denies abdominal pain or diarrhea  - Likely related to excessive alcohol use, check viral hepatitis panel and RUQ [place], repeat CMP in am    4. Hypertensive urgency  - Patient reports hx of HTN no longer on medications and has BP in 180/110 range in ED  - Improving with Ativan in ED, use labetalol as needed for now    5. Mild renal insufficiency  - SCr is 1.47 in ED, up from 0.8 one year ago  - Likely acute prerenal azotemia in setting of N/V  - Continue IVF hydration, avoid nephrotoxins, repeat chem panel in am    DVT prophylaxis: Lovenox  Code Status: Full    Family Communication: Discussed with patient  Disposition Plan:  Patient is from: Home  Anticipated d/c is to: Home  Anticipated d/c date is: 07/07/20 Patient currently: Pending additional workup of chest pain, treatment of alcohol withdrawal  Consults called: None  Admission status: Observation     Briscoe Deutscher, MD [facility]  07/06/2020, 12:45 AM

## 2020-07-06 NOTE — Progress Notes (Signed)
  Echocardiogram 2D Echocardiogram has been performed.  Noah Romero 07/06/2020, 11:08 AM

## 2020-07-06 NOTE — ED Notes (Signed)
Breakfast Ordered--Caven Perine  

## 2020-07-06 NOTE — ED Notes (Signed)
Lunch Tray Ordered @ 1120. 

## 2020-07-06 NOTE — ED Provider Notes (Addendum)
TIME SEEN: 12:19 AM  CHIEF COMPLAINT: Palpitations, nausea and vomiting, tingling all over  HPI: Patient is a 34 year old male with history of asthma who presents to the emergency department with feeling poorly today.  States that he woke up this morning and could not keep any food or drink down.  He has had multiple episodes of nonbloody, nonbilious vomiting.  He states that he also started having left-sided chest tightness and palpitations and could feel his heart beating.  He was concerned that his blood pressure was elevated.  States he has been told once before he had hypertension and was started on medications that he took for about 1 to 2 months but that was taken off by his PCP.  He states he did have some blurry vision today that has resolved.  No vision loss.  No headache.  No associated shortness of breath.  Reports he did have tingling in the left hand and foot but then noticed that it was also in the right hand as well.  No numbness or weakness.  Reports this tingling has improved.  He does report drinking alcohol 5 days a week.  States that he usually drinks at least a bottle of wine with his wife at night.  States that we will also drink liquor and he will have more than that.  He states sometimes in the mornings when he gets up he will be tremulous but has never thought of himself as an alcoholic or had alcohol withdrawal.  He denies any hallucinations.  Denies any illicit drug use.  States he has been drinking more than normal over the past couple of weeks because he had a friend die.  He denies fevers, cough, diarrhea, abdominal pain.  He has had both COVID-19 vaccinations.  ROS: See HPI Constitutional: no fever  Eyes: no drainage  ENT: no runny nose   Cardiovascular:   chest pain  Resp: no SOB  GI:  vomiting GU: no dysuria Integumentary: no rash  Allergy: no hives  Musculoskeletal: no leg swelling  Neurological: no slurred speech ROS otherwise negative  PAST MEDICAL  HISTORY/PAST SURGICAL HISTORY:  Past Medical History:  Diagnosis Date  . Allergy   . Asthma     MEDICATIONS:  Prior to Admission medications   Medication Sig Start Date End Date Taking? Authorizing Provider  albuterol (PROAIR HFA) 108 (90 Base) MCG/ACT inhaler Inhale 1-2 puffs into the lungs every 6 (six) hours as needed for wheezing or shortness of breath. 05/30/20   [name], Bonna Gains, NP  ibuprofen (ADVIL) 600 MG tablet Take 1 tablet (600 mg total) by mouth every 8 (eight) hours as needed. With food 05/30/20   [name], Bonna Gains, NP  olmesartan (BENICAR) 20 MG tablet Take 0.5 tablets (10 mg total) by mouth daily. 05/30/20   [name], Bonna Gains, NP    ALLERGIES:  No Known Allergies  SOCIAL HISTORY:  Social History   Tobacco Use  . Smoking status: Never Smoker  . Smokeless tobacco: Never Used  Substance Use Topics  . Alcohol use: Yes    Comment: 5X week    FAMILY HISTORY: Family History  Problem Relation Age of Onset  . Diabetes Mother   . Hyperlipidemia Father   . Hypertension Father   . Birth defects Paternal Grandfather   . Prostate cancer Other        GF dx in his 30  . CAD Other        GF  . Colon cancer Neg Hx  EXAM: BP (!) 141/114 (BP Location: Right Arm)   Pulse (!) 114   Temp 98.6 F (37 C) (Oral)   Resp 18   Ht 5\' 10"  (1.778 m)   Wt 77.1 kg   SpO2 100%   BMI 24.39 kg/m  CONSTITUTIONAL: Alert and oriented and responds appropriately to questions. Well-appearing; well-nourished HEAD: Normocephalic EYES: Conjunctivae clear, pupils appear equal, EOM appear intact ENT: normal nose; dry appearing mucous membranes NECK: Supple, normal ROM CARD: Regular and tachycardic; S1 and S2 appreciated; no murmurs, no clicks, no rubs, no gallops RESP: Normal chest excursion without splinting or tachypnea; breath sounds clear and equal bilaterally; no wheezes, no rhonchi, no rales, no hypoxia or respiratory distress, speaking full sentences ABD/GI: Normal bowel  sounds; non-distended; soft, non-tender, no rebound, no guarding, no peritoneal signs, no hepatosplenomegaly BACK:  The back appears normal EXT: Normal ROM in all joints; no deformity noted, no edema; no cyanosis, no calf tenderness or calf swelling SKIN: Normal color for age and race; warm; no rash on exposed skin NEURO: Moves all extremities equally, normal speech, patient is tremulous, normal sensation diffusely, no facial asymmetry PSYCH: Patient appears anxious.  MEDICAL DECISION MAKING: Patient here with what I suspect is alcohol withdrawal.  He reports drinking alcohol at least 5 days a week.  He tells me that it is least a bottle of wine with his wife every night.  I suspect that he is actually drinking more than less.  He does report intermittently waking up with tremors in the morning.  Labs are suggestive of alcoholic ketoacidosis.  He does have elevated liver function tests, AKI, elevated anion gap metabolic acidosis.  I do not think this is DKA.  His blood glucose is 154.  Will give IV fluids, Ativan, thiamine.  He received Zofran in triage and states he is feeling better.  His troponins were elevated.  The first was 66 and second was 76.  I suspect this is a combination of dehydration, demand ischemia.  His EKG shows nonspecific T wave abnormality with no old for comparison.  I have low suspicion that this is ACS.  Not having chest pain currently.  Will give dose of aspirin however.  PE also on the differential but no risk factors.  Doubt dissection.  Troponin leak may also be secondary to his significantly elevated blood pressures.  Will reassess blood pressure after dose of IV Ativan to determine if he needs other medications for blood pressure.  It appears he was previously on 10 mg of Benicar but states he has been off for several months.  Blood pressure was 150/100 during office visit on 05/30/2020.  Will start patient on CIWA protocol.  I feel he will need admission to the hospital.  ED  PROGRESS: 12:31 AM Discussed patient's case with hospitalist, Dr. 07/30/2020.  I have recommended admission and patient (and family if present) agree with this plan. Admitting physician will place admission orders.   I reviewed all nursing notes, vitals, pertinent previous records and reviewed/interpreted all EKGs, lab and urine results, imaging (as available).   Blood pressure continues to be elevated despite Ativan.  Hospitalist has ordered IV labetalol.    EKG Interpretation  Date/Time:  Wednesday July 05 2020 16:39:53 EDT Ventricular Rate:  123 PR Interval:  134 QRS Duration: 76 QT Interval:  302 QTC Calculation: 432 R Axis:   64 Text Interpretation: Sinus tachycardia Nonspecific T wave abnormality Abnormal ECG No old tracing to compare Confirmed by Mesner, 08-02-1976 [phone]) on  07/05/2020 11:25:29 PM       CRITICAL CARE Performed by: Baxter Hire Hiroki Wint   Total critical care time: 50 minutes  Critical care time was exclusive of separately billable procedures and treating other patients.  Critical care was necessary to treat or prevent imminent or life-threatening deterioration.  Critical care was time spent personally by me on the following activities: development of treatment plan with patient and/or surrogate as well as nursing, discussions with consultants, evaluation of patient's response to treatment, examination of patient, obtaining history from patient or surrogate, ordering and performing treatments and interventions, ordering and review of laboratory studies, ordering and review of radiographic studies, pulse oximetry and re-evaluation of patient's condition.   RODERIC LAMMERT was evaluated in Emergency Department on 07/06/2020 for the symptoms described in the history of present illness. He was evaluated in the context of the global COVID-19 pandemic, which necessitated consideration that the patient might be at risk for infection with the SARS-CoV-2 virus that causes COVID-19.  Institutional protocols and algorithms that pertain to the evaluation of patients at risk for COVID-19 are in a state of rapid change based on information released by regulatory bodies including the CDC and federal and state organizations. These policies and algorithms were followed during the patient's care in the ED.      Keniyah Gelinas, Layla Maw, DO 07/06/20 0058    Arliene Rosenow, Layla Maw, DO 07/06/20 [id number]

## 2020-07-12 DIAGNOSIS — Z20828 Contact with and (suspected) exposure to other viral communicable diseases: Secondary | ICD-10-CM | POA: Diagnosis not present

## 2020-07-13 ENCOUNTER — Other Ambulatory Visit: Payer: Self-pay

## 2020-07-14 ENCOUNTER — Ambulatory Visit (INDEPENDENT_AMBULATORY_CARE_PROVIDER_SITE_OTHER): Payer: BC Managed Care – PPO | Admitting: Nurse Practitioner

## 2020-07-14 ENCOUNTER — Encounter: Payer: Self-pay | Admitting: Nurse Practitioner

## 2020-07-14 VITALS — BP 142/88 | HR 89 | Temp 97.6°F | Ht 70.0 in | Wt 169.4 lb

## 2020-07-14 DIAGNOSIS — R7989 Other specified abnormal findings of blood chemistry: Secondary | ICD-10-CM | POA: Diagnosis not present

## 2020-07-14 DIAGNOSIS — F10230 Alcohol dependence with withdrawal, uncomplicated: Secondary | ICD-10-CM

## 2020-07-14 DIAGNOSIS — I10 Essential (primary) hypertension: Secondary | ICD-10-CM

## 2020-07-14 DIAGNOSIS — F10930 Alcohol use, unspecified with withdrawal, uncomplicated: Secondary | ICD-10-CM

## 2020-07-14 MED ORDER — AMLODIPINE BESYLATE 10 MG PO TABS: 10.0000 mg | ORAL_TABLET | Freq: Every day | ORAL | 3 refills | Status: DC

## 2020-07-14 NOTE — Patient Instructions (Addendum)
No need for repeat labs today Continue amlodipine for HTN Monitor Bp at least 2x/week Call office if BP>160/100. Maintain adequate oral hydration and DASH diet Avoid any ETOH use. Maintain upcoming appt.  Hypertension, Adult Hypertension is another name for high blood pressure. High blood pressure forces your heart to work harder to pump blood. This can cause problems over time. There are two numbers in a blood pressure reading. There is a top number (systolic) over a bottom number (diastolic). It is best to have a blood pressure that is below 120/80. Healthy choices can help lower your blood pressure, or you may need medicine to help lower it. What are the causes? The cause of this condition is not known. Some conditions may be related to high blood pressure. What increases the risk?  Smoking.  Having type 2 diabetes mellitus, high cholesterol, or both.  Not getting enough exercise or physical activity.  Being overweight.  Having too much fat, sugar, calories, or salt (sodium) in your diet.  Drinking too much alcohol.  Having long-term (chronic) kidney disease.  Having a family history of high blood pressure.  Age. Risk increases with age.  Race. You may be at higher risk if you are African American.  Gender. Men are at higher risk than women before age 80. After age 43, women are at higher risk than men.  Having obstructive sleep apnea.  Stress. What are the signs or symptoms?  High blood pressure may not cause symptoms. Very high blood pressure (hypertensive crisis) may cause: ? Headache. ? Feelings of worry or nervousness (anxiety). ? Shortness of breath. ? Nosebleed. ? A feeling of being sick to your stomach (nausea). ? Throwing up (vomiting). ? Changes in how you see. ? Very bad chest pain. ? Seizures. How is this treated?  This condition is treated by making healthy lifestyle changes, such as: ? Eating healthy foods. ? Exercising more. ? Drinking less  alcohol.  Your health care provider may prescribe medicine if lifestyle changes are not enough to get your blood pressure under control, and if: ? Your top number is above 130. ? Your bottom number is above 80.  Your personal target blood pressure may vary. Follow these instructions at home: Eating and drinking   If told, follow the DASH eating plan. To follow this plan: ? Fill one half of your plate at each meal with fruits and vegetables. ? Fill one fourth of your plate at each meal with whole grains. Whole grains include whole-wheat pasta, brown rice, and whole-grain bread. ? Eat or drink low-fat dairy products, such as skim milk or low-fat yogurt. ? Fill one fourth of your plate at each meal with low-fat (lean) proteins. Low-fat proteins include fish, chicken without skin, eggs, beans, and tofu. ? Avoid fatty meat, cured and processed meat, or chicken with skin. ? Avoid pre-made or processed food.  Eat less than 1,500 mg of salt each day.  Do not drink alcohol if: ? Your doctor tells you not to drink. ? You are pregnant, may be pregnant, or are planning to become pregnant.  If you drink alcohol: ? Limit how much you use to:  0-1 drink a day for women.  0-2 drinks a day for men. ? Be aware of how much alcohol is in your drink. In the [place] one drink equals one 12 oz bottle of beer (355 mL), one 5 oz glass of wine (148 mL), or one 1 oz glass of hard liquor (44 mL). Lifestyle  Work with your doctor to stay at a healthy weight or to lose weight. Ask your doctor what the best weight is for you.  Get at least 30 minutes of exercise most days of the week. This may include walking, swimming, or biking.  Get at least 30 minutes of exercise that strengthens your muscles (resistance exercise) at least 3 days a week. This may include lifting weights or doing Pilates.  Do not use any products that contain nicotine or tobacco, such as cigarettes, e-cigarettes, and chewing tobacco. If  you need help quitting, ask your doctor.  Check your blood pressure at home as told by your doctor.  Keep all follow-up visits as told by your doctor. This is important. Medicines  Take over-the-counter and prescription medicines only as told by your doctor. Follow directions carefully.  Do not skip doses of blood pressure medicine. The medicine does not work as well if you skip doses. Skipping doses also puts you at risk for problems.  Ask your doctor about side effects or reactions to medicines that you should watch for. Contact a doctor if you:  Think you are having a reaction to the medicine you are taking.  Have headaches that keep coming back (recurring).  Feel dizzy.  Have swelling in your ankles.  Have trouble with your vision. Get help right away if you:  Get a very bad headache.  Start to feel mixed up (confused).  Feel weak or numb.  Feel faint.  Have very bad pain in your: ? Chest. ? Belly (abdomen).  Throw up more than once.  Have trouble breathing. Summary  Hypertension is another name for high blood pressure.  High blood pressure forces your heart to work harder to pump blood.  For most people, a normal blood pressure is less than 120/80.  Making healthy choices can help lower blood pressure. If your blood pressure does not get lower with healthy choices, you may need to take medicine. This information is not intended to replace advice given to you by your health care provider. Make sure you discuss any questions you have with your health care provider. Document Revised: 08/19/2018 Document Reviewed: 08/19/2018 Elsevier Patient Education  2020 [employer].

## 2020-07-14 NOTE — Assessment & Plan Note (Addendum)
resolved with ETOH cessation and adequate oral hydration

## 2020-07-14 NOTE — Assessment & Plan Note (Signed)
Improving BP with amlodipine compliance. Denies any Adverse reaction Home BP reading in last 1week: 140s/90s on average. No chest pain, no dizziness, no palpitation, no headache. He had made changes to his diet, has stopped ETOH use, and increase oral hydration.  Maintain amlodipine, Refill sent F/up in 2weeks

## 2020-07-14 NOTE — Assessment & Plan Note (Signed)
Denies any ETOH use since 07/04/2020. occasional marijuana use, but none in last 3-4weeks.

## 2020-07-14 NOTE — Progress Notes (Signed)
Subjective:  Patient ID: Noah Romero, male    DOB: [birth year]  Age: 34 y.o. MRN: [id number]  CC: Hospitalization Follow-up (chest pains-ekgs and echo was done-cardiologist never spoke to pt//[place] wants to make sure hes ok-pt is fasting//pt has last BP readings)  HPI  Essential hypertension Improving BP with amlodipine compliance. Denies any Adverse reaction Home BP reading in last 1week: 140s/90s on average. No chest pain, no dizziness, no palpitation, no headache. He had made changes to his diet, has stopped ETOH use, and increase oral hydration.  Maintain amlodipine, Refill sent F/up in 2weeks  Alcohol withdrawal (HCC) Denies any ETOH use since 07/04/2020. occasional marijuana use, but none in last 3-4weeks.  Elevated LFTs resolved with ETOH cessation and adequate oral hydration   Reviewed past Medical, Social and Family history today.  Outpatient Medications Prior to Visit  Medication Sig Dispense Refill   albuterol (PROAIR HFA) 108 (90 Base) MCG/ACT inhaler Inhale 1-2 puffs into the lungs every 6 (six) hours as needed for wheezing or shortness of breath. 18 g 1   Cholecalciferol (VITAMIN D3 PO) Take 1 capsule by mouth daily.     Multiple Vitamin (MULTIVITAMIN WITH MINERALS) TABS tablet Take 1 tablet by mouth daily.     Omega-3 Fatty Acids (FISH OIL) 1000 MG CAPS Take 1 capsule by mouth daily.     amLODipine (NORVASC) 10 MG tablet Take 1 tablet (10 mg total) by mouth daily. 30 tablet 0   loratadine-pseudoephedrine (CLARITIN-D 24-HOUR) 10-240 MG 24 hr tablet Take 1 tablet by mouth daily as needed for allergies.     ondansetron (ZOFRAN) 4 MG tablet Take 1 tablet (4 mg total) by mouth every 6 (six) hours as needed for nausea. (Patient not taking: Reported on 07/14/2020) 20 tablet 0   pantoprazole (PROTONIX) 40 MG tablet Take 1 tablet (40 mg total) by mouth daily. (Patient not taking: Reported on 07/14/2020) 30 tablet 0   No  facility-administered medications prior to visit.    ROS See HPI  Objective:  BP (!) 142/88    Pulse 89    Temp 97.6 F (36.4 C) (Tympanic)    Ht 5\' 10"  (1.778 m)    Wt 169 lb 6.4 oz (76.8 kg)    SpO2 99%    BMI 24.31 kg/m   Physical Exam Vitals reviewed.  Cardiovascular:     Rate and Rhythm: Normal rate and regular rhythm.     Pulses: Normal pulses.     Heart sounds: Normal heart sounds.  Pulmonary:     Effort: Pulmonary effort is normal.     Breath sounds: Normal breath sounds.  Musculoskeletal:     Right lower leg: No edema.     Left lower leg: No edema.  Neurological:     Mental Status: He is alert and oriented to person, place, and time.  Psychiatric:        Mood and Affect: Mood normal.        Behavior: Behavior normal.        Thought Content: Thought content normal.    Assessment & Plan:  This visit occurred during the SARS-CoV-2 public health emergency.  Safety protocols were in place, including screening questions prior to the visit, additional usage of staff PPE, and extensive cleaning of exam room while observing appropriate contact time as indicated for disinfecting solutions.   Noah Romero was seen today for hospitalization follow-up.  Diagnoses and all orders for this visit:  Essential hypertension -  amLODipine (NORVASC) 10 MG tablet; Take 1 tablet (10 mg total) by mouth daily.  Alcohol withdrawal syndrome without complication (HCC)  Elevated LFTs    Problem List Items Addressed This Visit      Cardiovascular and Mediastinum   Essential hypertension - Primary    Improving BP with amlodipine compliance. Denies any Adverse reaction Home BP reading in last 1week: 140s/90s on average. No chest pain, no dizziness, no palpitation, no headache. He had made changes to his diet, has stopped ETOH use, and increase oral hydration.  Maintain amlodipine, Refill sent F/up in 2weeks      Relevant Medications   amLODipine (NORVASC) 10 MG tablet     Nervous  and Auditory   RESOLVED: Alcohol withdrawal (HCC)    Denies any ETOH use since 07/04/2020. occasional marijuana use, but none in last 3-4weeks.        Other   Elevated LFTs    resolved with ETOH cessation and adequate oral hydration         I have spent with this patient regarding history taking, documentation, review of recent ED (labs, radiology,and note), formulating plan and discussing treatment options with patient.  Follow-up: No follow-ups on file.  Alysia Penna, NP

## 2020-07-31 ENCOUNTER — Other Ambulatory Visit: Payer: Self-pay

## 2020-07-31 ENCOUNTER — Encounter: Payer: Self-pay | Admitting: Nurse Practitioner

## 2020-07-31 ENCOUNTER — Ambulatory Visit (INDEPENDENT_AMBULATORY_CARE_PROVIDER_SITE_OTHER): Payer: BC Managed Care – PPO | Admitting: Nurse Practitioner

## 2020-07-31 VITALS — BP 132/88 | HR 88 | Temp 96.4°F | Ht 70.0 in | Wt 173.6 lb

## 2020-07-31 DIAGNOSIS — Z0000 Encounter for general adult medical examination without abnormal findings: Secondary | ICD-10-CM | POA: Diagnosis not present

## 2020-07-31 DIAGNOSIS — I10 Essential (primary) hypertension: Secondary | ICD-10-CM

## 2020-07-31 DIAGNOSIS — Z136 Encounter for screening for cardiovascular disorders: Secondary | ICD-10-CM | POA: Diagnosis not present

## 2020-07-31 DIAGNOSIS — Z13220 Encounter for screening for lipoid disorders: Secondary | ICD-10-CM | POA: Diagnosis not present

## 2020-07-31 LAB — LIPID PANEL
Cholesterol: 177 mg/dL (ref 0–200)
HDL: 63.8 mg/dL (ref >39.00)
LDL Cholesterol: 102 mg/dL — ABNORMAL HIGH (ref 0–99)
NonHDL: 113.14
Total CHOL/HDL Ratio: 3
Triglycerides: 56 mg/dL (ref 0.0–149.0)
VLDL: 11.2 mg/dL (ref 0.0–40.0)

## 2020-07-31 LAB — CBC WITH DIFFERENTIAL/PLATELET
Basophils Absolute: 0.1 10*3/uL (ref 0.0–0.1)
Basophils Relative: 1.1 % (ref 0.0–3.0)
Eosinophils Absolute: 0.3 10*3/uL (ref 0.0–0.7)
Eosinophils Relative: 5.7 % — ABNORMAL HIGH (ref 0.0–5.0)
HCT: 39.8 % (ref 39.0–52.0)
Hemoglobin: 13.7 g/dL (ref 13.0–17.0)
Lymphocytes Relative: 29.5 % (ref 12.0–46.0)
Lymphs Abs: 1.5 10*3/uL (ref 0.7–4.0)
MCHC: 34.4 g/dL (ref 30.0–36.0)
MCV: 93.9 fl (ref 78.0–100.0)
Monocytes Absolute: 0.4 10*3/uL (ref 0.1–1.0)
Monocytes Relative: 8.2 % (ref 3.0–12.0)
Neutro Abs: 2.8 10*3/uL (ref 1.4–7.7)
Neutrophils Relative %: 55.5 % (ref 43.0–77.0)
Platelets: 325 10*3/uL (ref 150.0–400.0)
RBC: 4.24 Mil/uL (ref 4.22–5.81)
RDW: 13.3 % (ref 11.5–15.5)
WBC: 5.1 10*3/uL (ref 4.0–10.5)

## 2020-07-31 LAB — BASIC METABOLIC PANEL
BUN: 12 mg/dL (ref 6–23)
CO2: 27 mEq/L (ref 19–32)
Calcium: 10.1 mg/dL (ref 8.4–10.5)
Chloride: 100 mEq/L (ref 96–112)
Creatinine, Ser: 0.86 mg/dL (ref 0.40–1.50)
GFR: 102.24 mL/min (ref >60.00)
Glucose, Bld: 101 mg/dL — ABNORMAL HIGH (ref 70–99)
Potassium: 4.9 mEq/L (ref 3.5–5.1)
Sodium: 136 mEq/L (ref 135–145)

## 2020-07-31 LAB — TSH: TSH: 1.27 u[IU]/mL (ref 0.35–4.50)

## 2020-07-31 NOTE — Assessment & Plan Note (Addendum)
Improving BP No HA, SOB, dizziness, CP, or LE edema Home BP readings: 140s/9s-130s/80s Current use of amlodipine BP Readings from Last 3 Encounters:  07/31/20 132/88  07/14/20 (!) 142/88  07/06/20 (!) 156/106   Continue current medication. F/up in 85months

## 2020-07-31 NOTE — Patient Instructions (Addendum)
Maintain current medications.  Go to lab for blood draw.  Preventive Care 74-33 Years Old, Male Preventive care refers to lifestyle choices and visits with your health care provider that can promote health and wellness. This includes:  A yearly physical exam. This is also called an annual well check.  Regular dental and eye exams.  Immunizations.  Screening for certain conditions.  Healthy lifestyle choices, such as eating a healthy diet, getting regular exercise, not using drugs or products that contain nicotine and tobacco, and limiting alcohol use. What can I expect for my preventive care visit? Physical exam Your health care provider will check:  Height and weight. These may be used to calculate body mass index (BMI), which is a measurement that tells if you are at a healthy weight.  Heart rate and blood pressure.  Your skin for abnormal spots. Counseling Your health care provider may ask you questions about:  Alcohol, tobacco, and drug use.  Emotional well-being.  Home and relationship well-being.  Sexual activity.  Eating habits.  Work and work Statistician. What immunizations do I need?  Influenza (flu) vaccine  This is recommended every year. Tetanus, diphtheria, and pertussis (Tdap) vaccine  You may need a Td booster every 10 years. Varicella (chickenpox) vaccine  You may need this vaccine if you have not already been vaccinated. Human papillomavirus (HPV) vaccine  If recommended by your health care provider, you may need three doses over 6 months. Measles, mumps, and rubella (MMR) vaccine  You may need at least one dose of MMR. You may also need a second dose. Meningococcal conjugate (MenACWY) vaccine  One dose is recommended if you are 65-4 years old and a Market researcher living in a residence hall, or if you have one of several medical conditions. You may also need additional booster doses. Pneumococcal conjugate (PCV13) vaccine  You  may need this if you have certain conditions and were not previously vaccinated. Pneumococcal polysaccharide (PPSV23) vaccine  You may need one or two doses if you smoke cigarettes or if you have certain conditions. Hepatitis A vaccine  You may need this if you have certain conditions or if you travel or work in places where you may be exposed to hepatitis A. Hepatitis B vaccine  You may need this if you have certain conditions or if you travel or work in places where you may be exposed to hepatitis B. Haemophilus influenzae type b (Hib) vaccine  You may need this if you have certain risk factors. You may receive vaccines as individual doses or as more than one vaccine together in one shot (combination vaccines). Talk with your health care provider about the risks and benefits of combination vaccines. What tests do I need? Blood tests  Lipid and cholesterol levels. These may be checked every 5 years starting at age 55.  Hepatitis C test.  Hepatitis B test. Screening   Diabetes screening. This is done by checking your blood sugar (glucose) after you have not eaten for a while (fasting).  Sexually transmitted disease (STD) testing. Talk with your health care provider about your test results, treatment options, and if necessary, the need for more tests. Follow these instructions at home: Eating and drinking   Eat a diet that includes fresh fruits and vegetables, whole grains, lean protein, and low-fat dairy products.  Take vitamin and mineral supplements as recommended by your health care provider.  Do not drink alcohol if your health care provider tells you not to drink.  If  you drink alcohol: ? Limit how much you have to 0-2 drinks a day. ? Be aware of how much alcohol is in your drink. In the [place] one drink equals one 12 oz bottle of beer (355 mL), one 5 oz glass of wine (148 mL), or one 1 oz glass of hard liquor (44 mL). Lifestyle  Take daily care of your teeth and  gums.  Stay active. Exercise for at least 30 minutes on 5 or more days each week.  Do not use any products that contain nicotine or tobacco, such as cigarettes, e-cigarettes, and chewing tobacco. If you need help quitting, ask your health care provider.  If you are sexually active, practice safe sex. Use a condom or other form of protection to prevent STIs (sexually transmitted infections). What's next?  Go to your health care provider once a year for a well check visit.  Ask your health care provider how often you should have your eyes and teeth checked.  Stay up to date on all vaccines. This information is not intended to replace advice given to you by your health care provider. Make sure you discuss any questions you have with your health care provider. Document Revised: 12/03/2018 Document Reviewed: 12/03/2018 Elsevier Patient Education  2020 [employer].

## 2020-07-31 NOTE — Progress Notes (Signed)
Subjective:    Patient ID: Noah Romero, male    DOB: 08-06-1986, 34 y.o.   MRN: [id number]  Patient presents today for CPE and  eval of HTN.  HPI Essential hypertension Improving BP No HA, SOB, dizziness, CP, or LE edema Home BP readings: 140s/9s-130s/80s Current use of amlodipine BP Readings from Last 3 Encounters:  07/31/20 132/88  07/14/20 (!) 142/88  07/06/20 (!) 156/106   Continue current medication. F/up in 33months  Sexual History (orientation,birth control, marital status, STD):married, male partner, does testicular exam in shower, no need for STD screen  Depression/Suicide: Depression screen [facility] 2/9 07/31/2020 07/14/2020  Decreased Interest 0 0  Down, Depressed, Hopeless 0 0  PHQ - 2 Score 0 0  Some encounter information is confidential and restricted. Go to Review Flowsheets activity to see all data.   Vision:not needed  Dental:up to date  Immunizations: (TDAP, Hep C screen, Pneumovax, Influenza, zoster)  Health Maintenance  Topic Date Due  . Flu Shot  07/23/2020  . Tetanus Vaccine  12/23/2020  . COVID-19 Vaccine  Completed  .  Hepatitis C: One time screening is recommended by Center for Disease Control  (CDC) for  adults born from [birth year] through 1964.   Completed  . HIV Screening  Completed   Diet:DASH diet.  Weight:  Wt Readings from Last 3 Encounters:  07/31/20 173 lb 9.6 oz (78.7 kg)  07/14/20 169 lb 6.4 oz (76.8 kg)  07/05/20 170 lb (77.1 kg)   Fall Risk: Fall Risk  07/31/2020 07/14/2020 09/13/2013  Falls in the past year? 0 0 No  Number falls in past yr: 0 0 -  Injury with Fall? 0 1 -  Some encounter information is confidential and restricted. Go to Review Flowsheets activity to see all data.   Medications and allergies reviewed with patient and updated if appropriate.  Patient Active Problem List   Diagnosis Date Noted  . Mild renal insufficiency 07/06/2020  . Chest pain 07/06/2020  . Elevated troponin 07/06/2020  . Elevated LFTs 07/06/2020    . Hypertensive urgency 07/06/2020  . Closed nondisplaced avulsion fracture of left talus 05/30/2020  . Essential hypertension 08/24/2019  . Intrinsic asthma 09/13/2013    Current Outpatient Medications on File Prior to Visit  Medication Sig Dispense Refill  . albuterol (PROAIR HFA) 108 (90 Base) MCG/ACT inhaler Inhale 1-2 puffs into the lungs every 6 (six) hours as needed for wheezing or shortness of breath. 18 g 1  . amLODipine (NORVASC) 10 MG tablet Take 1 tablet (10 mg total) by mouth daily. 90 tablet 3  . Cholecalciferol (VITAMIN D3 PO) Take 1 capsule by mouth daily.    . Multiple Vitamin (MULTIVITAMIN WITH MINERALS) TABS tablet Take 1 tablet by mouth daily.    . Omega-3 Fatty Acids (FISH OIL) 1000 MG CAPS Take 1 capsule by mouth daily.     No current facility-administered medications on file prior to visit.    Past Medical History:  Diagnosis Date  . Allergy   . Asthma     Past Surgical History:  Procedure Laterality Date  . NO PAST SURGERIES      Social History   Socioeconomic History  . Marital status: Married    Spouse name: Not on file  . Number of children: 0  . Years of education: Not on file  . Highest education level: Not on file  Occupational History  . Occupation: Leisure centre manager , Heritage manager: [employer]  Tobacco Use  .  Smoking status: Never Smoker  . Smokeless tobacco: Never Used  Substance and Sexual Activity  . Alcohol use: Not Currently  . Drug use: No  . Sexual activity: Yes    Birth control/protection: None  Other Topics Concern  . Not on file  Social History Narrative  . Not on file   Social Determinants of Health   Financial Resource Strain:   . Difficulty of Paying Living Expenses:   Food Insecurity:   . Worried About Programme researcher, broadcasting/film/video in the Last Year:   . Barista in the Last Year:   Transportation Needs:   . Freight forwarder (Medical):   Marland Kitchen Lack of Transportation (Non-Medical):   Physical Activity:   . Days of  Exercise per Week:   . Minutes of Exercise per Session:   Stress:   . Feeling of Stress :   Social Connections:   . Frequency of Communication with Friends and Family:   . Frequency of Social Gatherings with Friends and Family:   . Attends Religious Services:   . Active Member of Clubs or Organizations:   . Attends Banker Meetings:   Marland Kitchen Marital Status:     Family History  Problem Relation Age of Onset  . Diabetes Mother   . Hyperlipidemia Father   . Hypertension Father   . Birth defects Paternal Grandfather   . Cancer Paternal Grandfather 41       Prostate  . Hearing loss Maternal Grandfather 20       CAD with MI, cause of death  . Colon cancer Neg Hx         Review of Systems  Constitutional: Negative for fever, malaise/fatigue and weight loss.  HENT: Negative for congestion and sore throat.   Eyes:       Negative for visual changes  Respiratory: Negative for cough and shortness of breath.   Cardiovascular: Negative for chest pain, palpitations and leg swelling.  Gastrointestinal: Negative for blood in stool, constipation, diarrhea and heartburn.  Genitourinary: Negative for dysuria, frequency and urgency.  Musculoskeletal: Negative for falls, joint pain and myalgias.  Skin: Negative for rash.  Neurological: Negative for dizziness, sensory change and headaches.  Endo/Heme/Allergies: Does not bruise/bleed easily.  Psychiatric/Behavioral: Negative for depression, substance abuse and suicidal ideas. The patient is not nervous/anxious and does not have insomnia.    Objective:   Vitals:   07/31/20 1016  BP: 132/88  Pulse: 88  Temp: (!) 96.4 F (35.8 C)  SpO2: 99%    Body mass index is 24.91 kg/m.   Physical Examination:  Physical Exam Vitals reviewed.  Constitutional:      General: He is not in acute distress.    Appearance: He is well-developed.  HENT:     Right Ear: Tympanic membrane, ear canal and external ear normal.     Left Ear:  Tympanic membrane, ear canal and external ear normal.  Eyes:     Extraocular Movements: Extraocular movements intact.     Conjunctiva/sclera: Conjunctivae normal.  Cardiovascular:     Rate and Rhythm: Normal rate and regular rhythm.     Heart sounds: Normal heart sounds.  Pulmonary:     Effort: Pulmonary effort is normal. No respiratory distress.     Breath sounds: Normal breath sounds.  Chest:     Chest wall: No tenderness.  Abdominal:     General: Bowel sounds are normal.     Palpations: Abdomen is soft.  Musculoskeletal:  General: Normal range of motion.     Cervical back: Normal range of motion and neck supple.     Right lower leg: No edema.     Left lower leg: No edema.  Lymphadenopathy:     Cervical: No cervical adenopathy.  Skin:    General: Skin is warm and dry.  Neurological:     Mental Status: He is alert and oriented to person, place, and time.     Deep Tendon Reflexes: Reflexes are normal and symmetric.  Psychiatric:        Mood and Affect: Mood normal.        Behavior: Behavior normal.        Thought Content: Thought content normal.     ASSESSMENT and PLAN: This visit occurred during the SARS-CoV-2 public health emergency.  Safety protocols were in place, including screening questions prior to the visit, additional usage of staff PPE, and extensive cleaning of exam room while observing appropriate contact time as indicated for disinfecting solutions.   Logen was seen today for annual exam.  Diagnoses and all orders for this visit:  Preventative health care -     CBC with Differential/Platelet -     Lipid panel -     TSH  Essential hypertension -     Basic metabolic panel  Encounter for lipid screening for cardiovascular disease -     Lipid panel        Problem List Items Addressed This Visit      Cardiovascular and Mediastinum   Essential hypertension    Improving BP No HA, SOB, dizziness, CP, or LE edema Home BP readings:  140s/9s-130s/80s Current use of amlodipine BP Readings from Last 3 Encounters:  07/31/20 132/88  07/14/20 (!) 142/88  07/06/20 (!) 156/106   Continue current medication. F/up in 51months      Relevant Orders   Basic metabolic panel    Other Visit Diagnoses    Preventative health care    -  Primary   Relevant Orders   CBC with Differential/Platelet   Lipid panel   TSH   Encounter for lipid screening for cardiovascular disease       Relevant Orders   Lipid panel      Follow up: Return in about 3 months (around 10/31/2020) for HTN (video, ).  Alysia Penna, NP

## 2020-09-07 DIAGNOSIS — Z23 Encounter for immunization: Secondary | ICD-10-CM | POA: Diagnosis not present

## 2020-11-01 ENCOUNTER — Telehealth: Payer: BC Managed Care – PPO | Admitting: Nurse Practitioner

## 2021-07-13 DIAGNOSIS — M7581 Other shoulder lesions, right shoulder: Secondary | ICD-10-CM | POA: Diagnosis not present

## 2021-09-20 ENCOUNTER — Ambulatory Visit: Payer: BC Managed Care – PPO | Admitting: Nurse Practitioner

## 2021-10-31 DIAGNOSIS — S46011A Strain of muscle(s) and tendon(s) of the rotator cuff of right shoulder, initial encounter: Secondary | ICD-10-CM | POA: Diagnosis not present

## 2021-11-28 IMAGING — DX DG ANKLE COMPLETE 3+V*L*
3 series · 3 of 3 positions shown · non-contrast
Comparison: Plain films left ankle 06/05/2015.

CLINICAL DATA: Left foot and ankle pain for 3 days since an
unspecified injury. Initial encounter.

EXAM:
LEFT ANKLE COMPLETE - 3+ VIEW

[ankle ap]
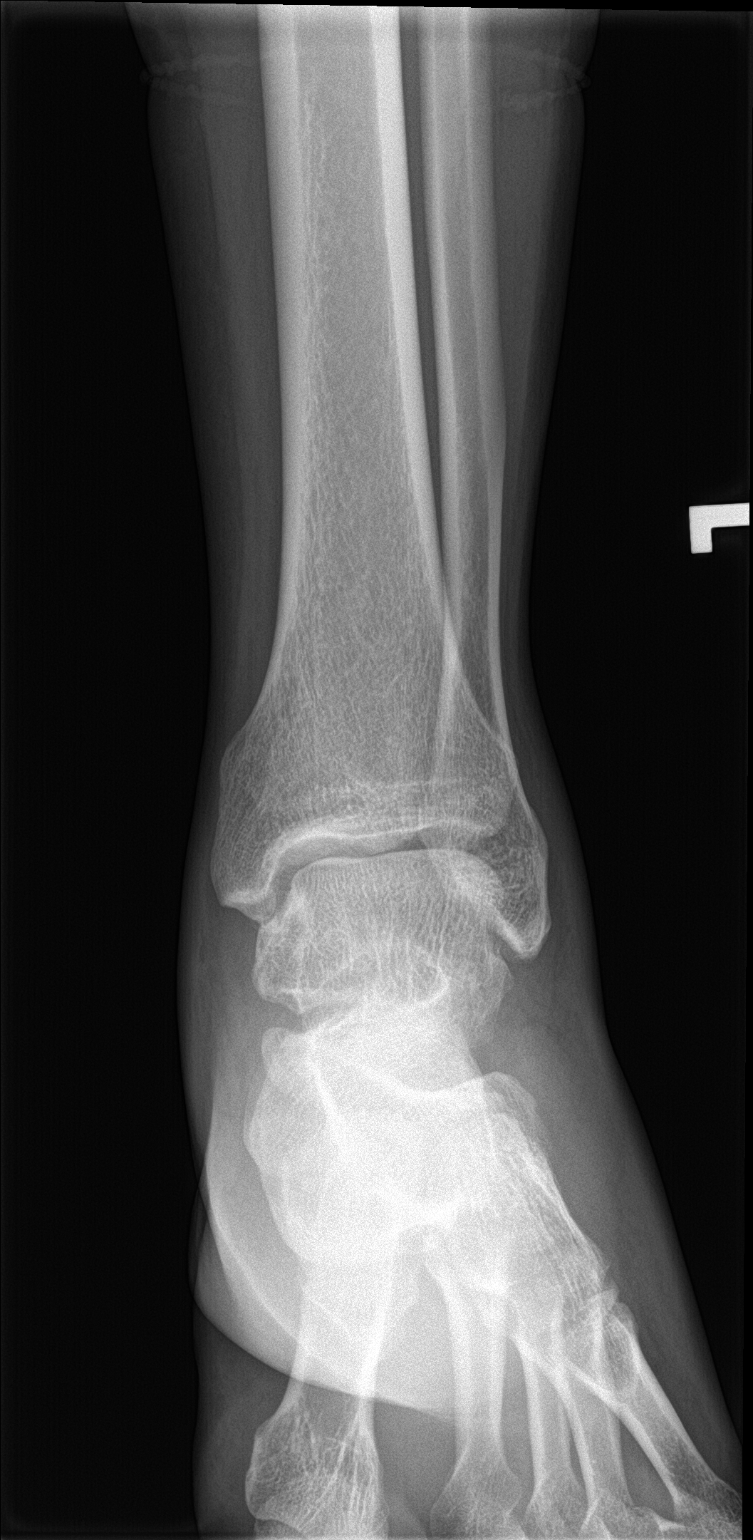

[ankle obl]
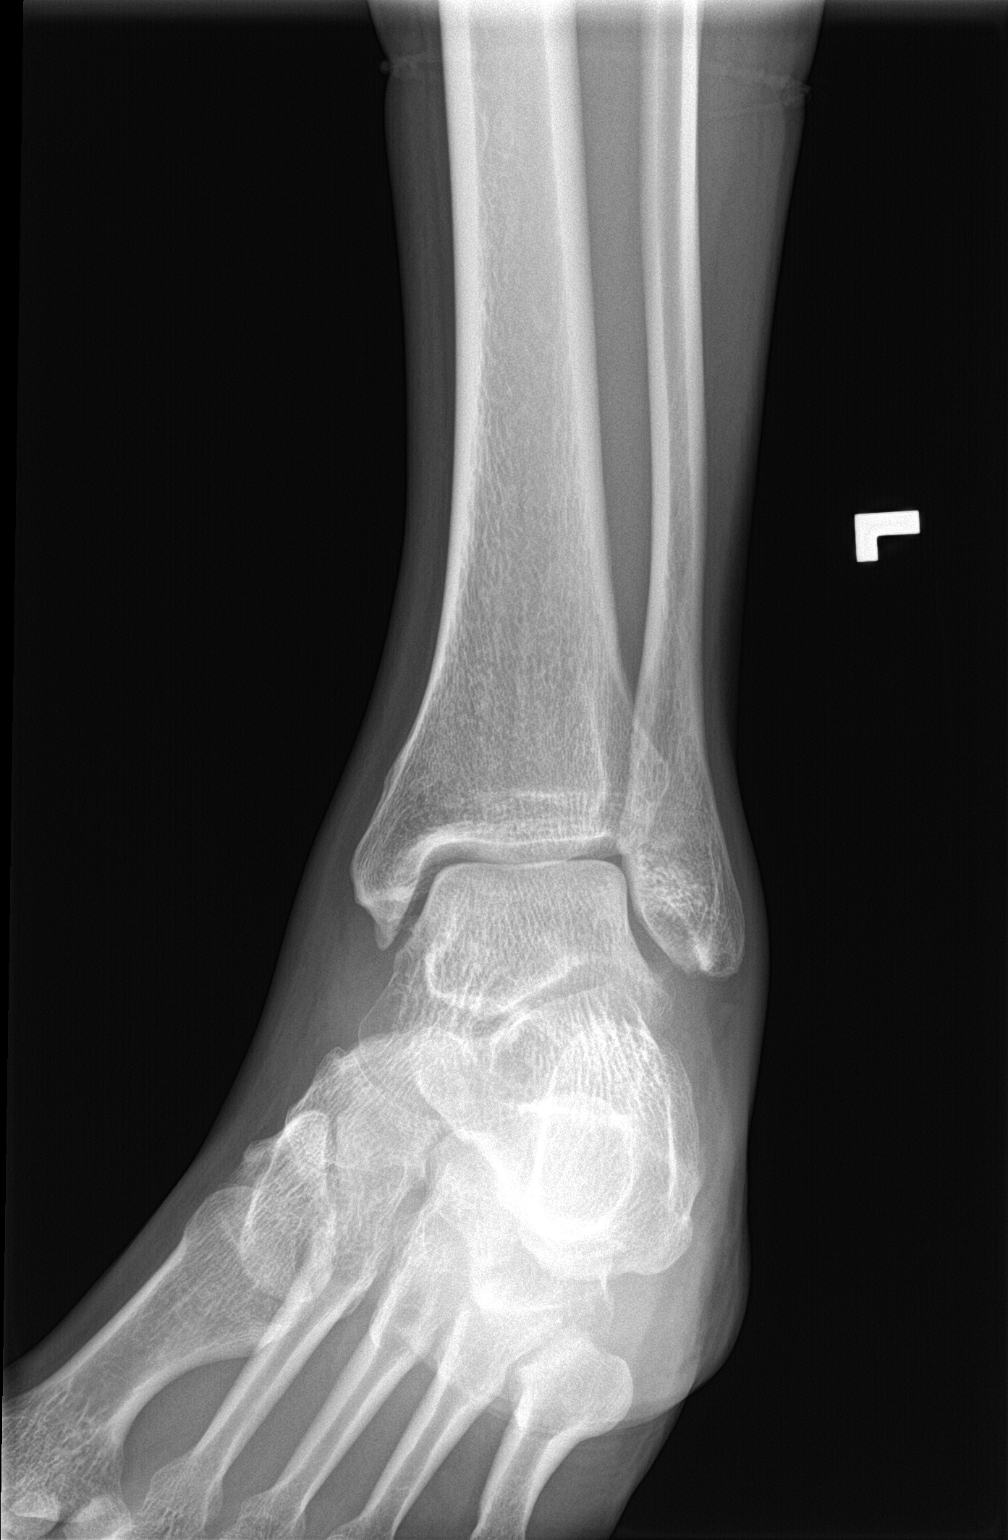

[ankle lat]
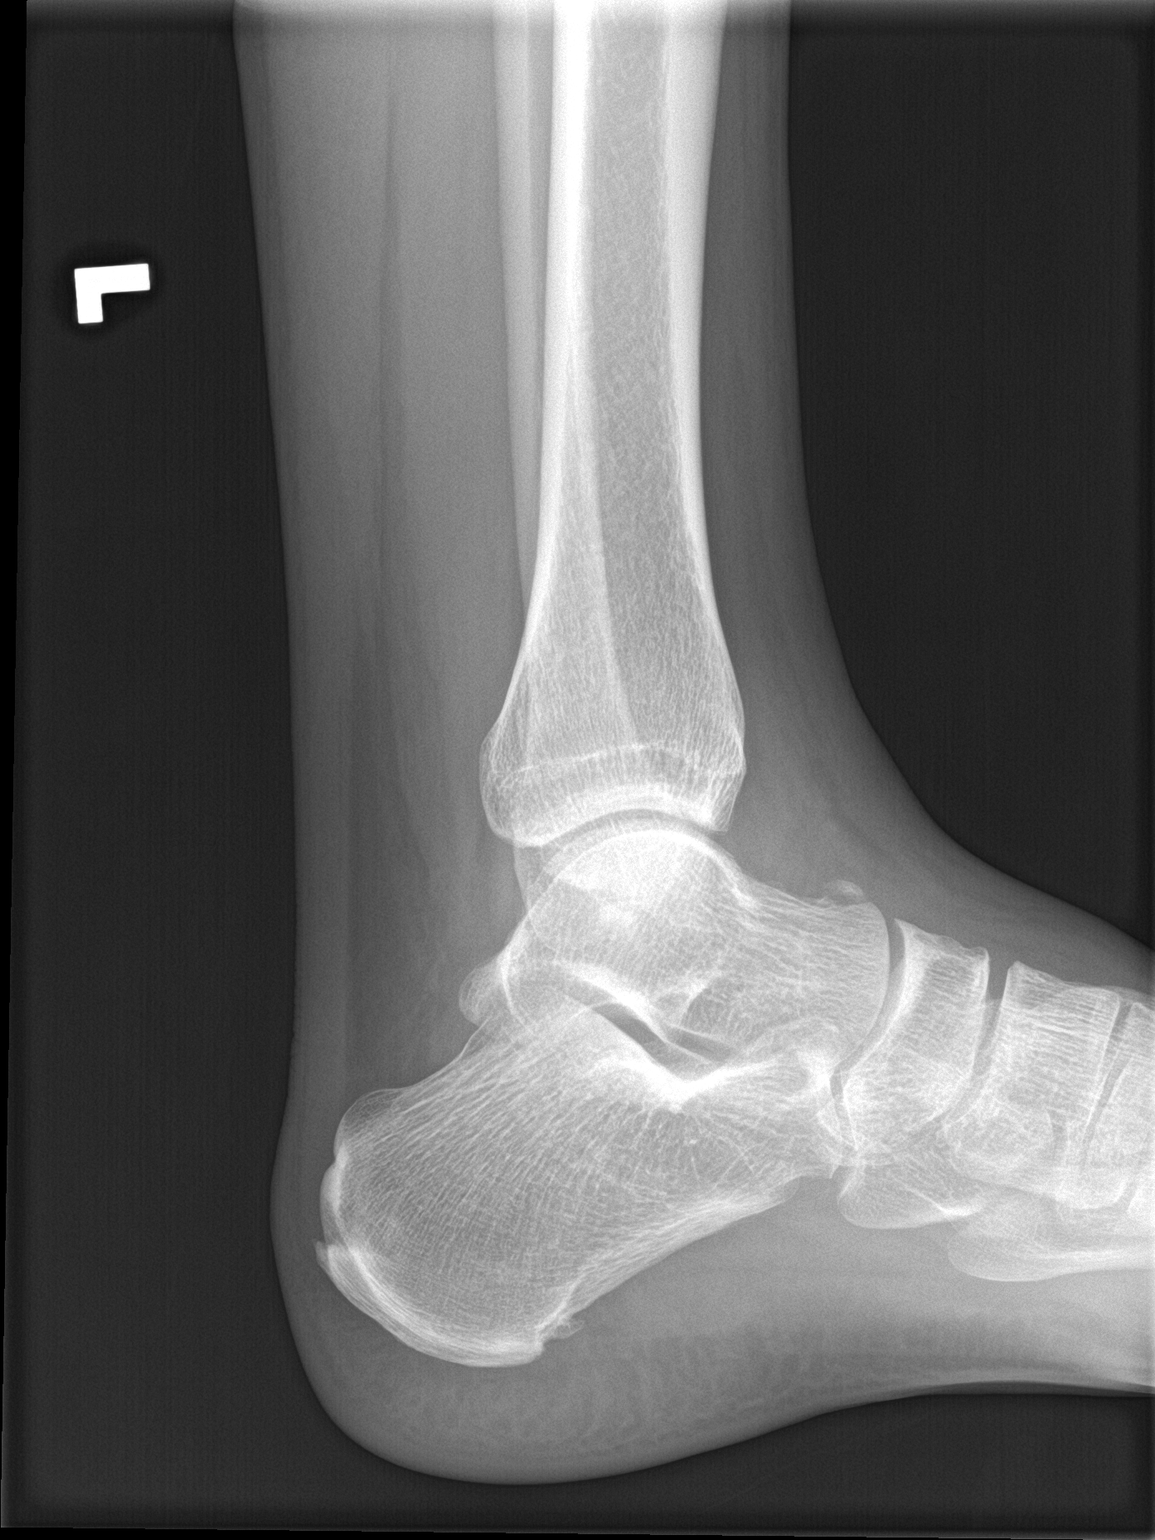

[3 of 3 positions shown; findings below may reference images not displayed]

FINDINGS: Small avulsion fracture off the dorsal aspect of the distal neck of
the talus is new since the prior exam. Tiny plantar calcaneal spur
noted. Imaged bones otherwise appear normal. There is some soft
tissue swelling about the ankle.
IMPRESSION: Acute, small avulsion fracture dorsal neck of the distal talus with
associated soft tissue swelling.

## 2021-11-28 IMAGING — DX DG FOOT COMPLETE 3+V*L*
3 series · 3 of 3 positions shown · non-contrast
Comparison: Plain films left ankle 06/05/2015.

CLINICAL DATA: Left foot pain and swelling for 3 days since an
unspecified injury. Initial encounter.

EXAM:
LEFT FOOT - COMPLETE 3+ VIEW

[foot obl]
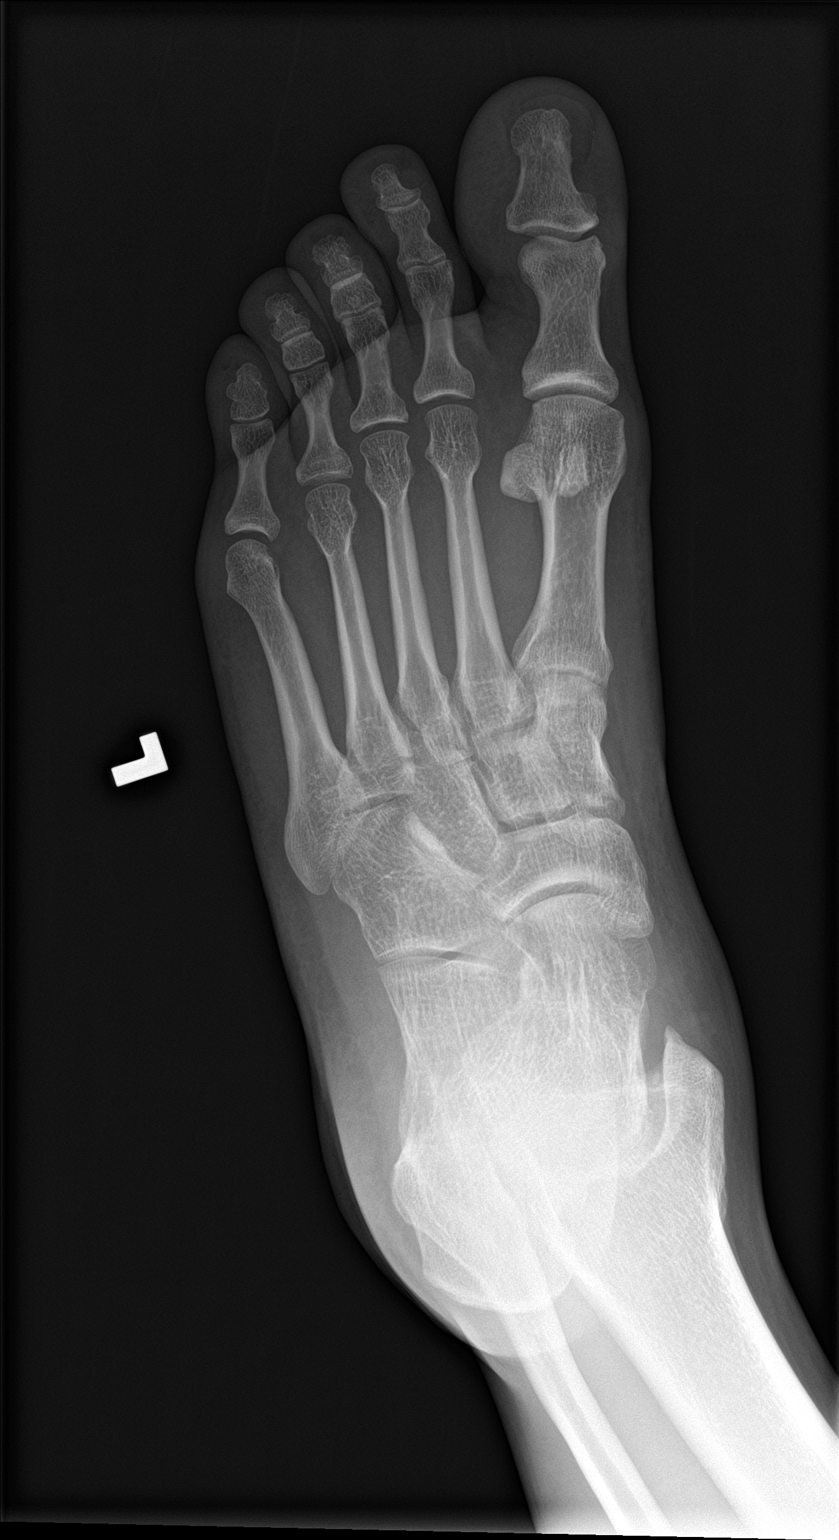

[foot lat]
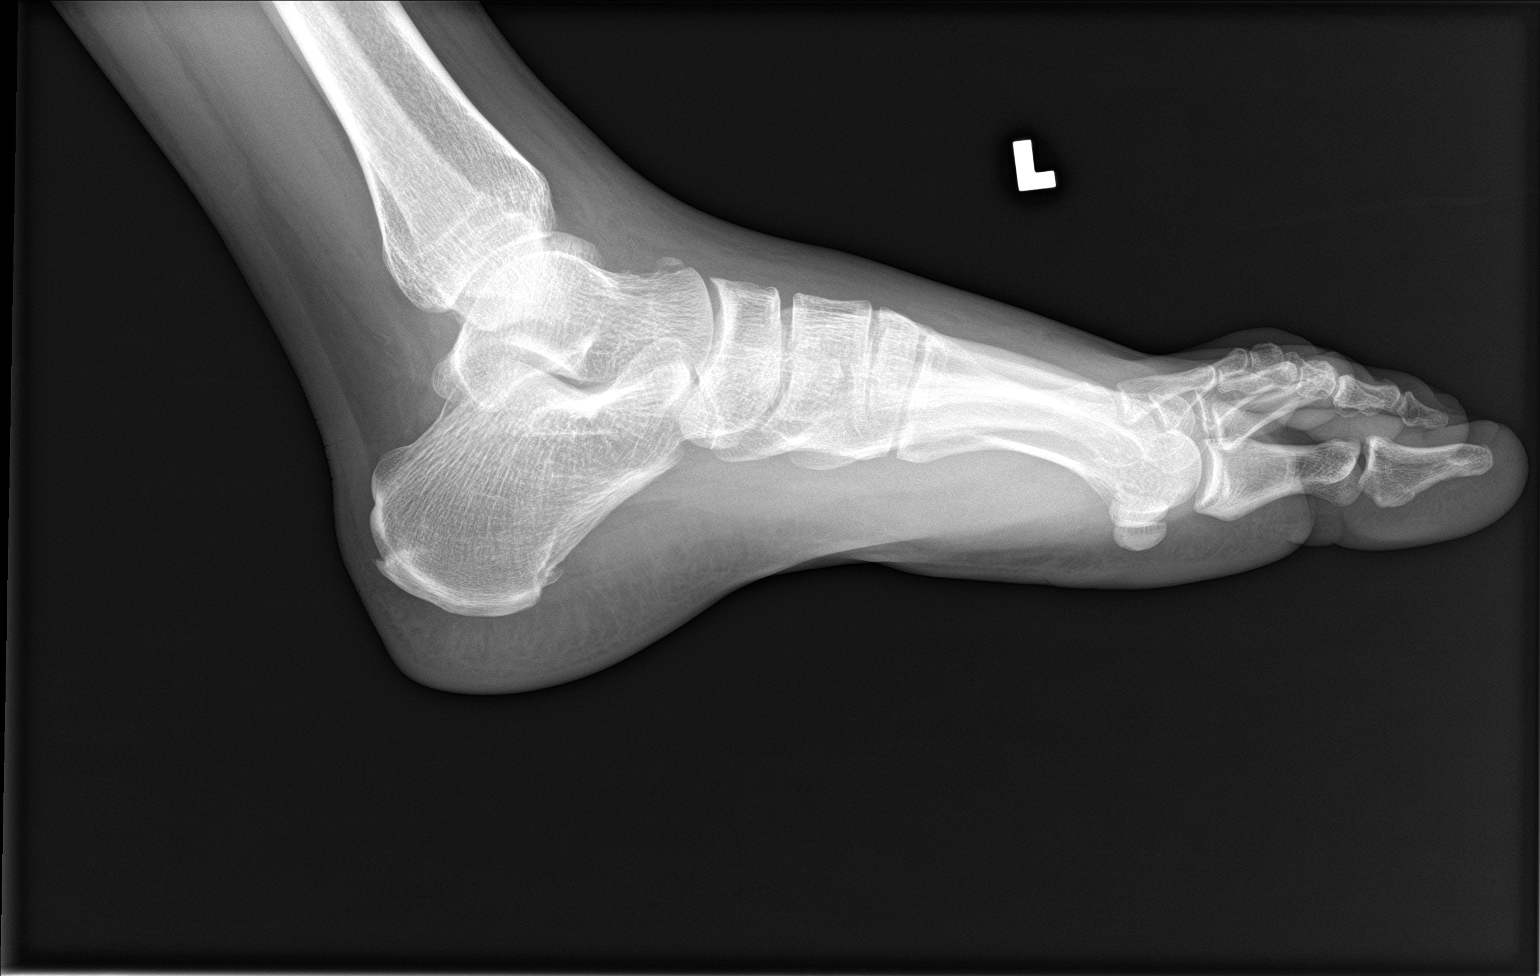

[foot ap]
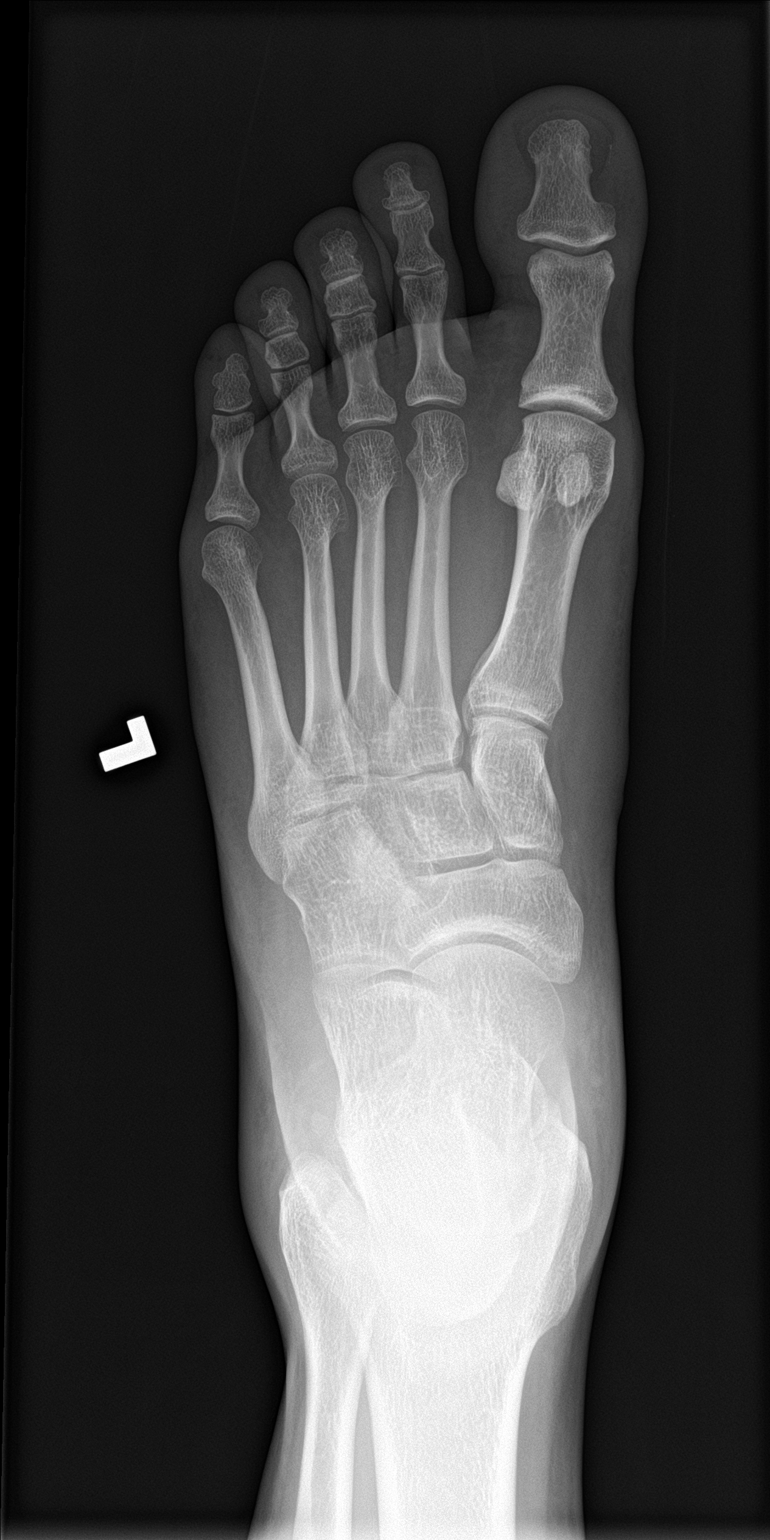

[3 of 3 positions shown; findings below may reference images not displayed]

FINDINGS: There is a small avulsion fracture off the dorsal neck of the distal
talus which is new since the prior examination. No other bony
abnormality is identified. Soft tissues of the foot are swollen.
IMPRESSION: Acute, small avulsion fracture dorsal neck of the distal talus with
associated soft tissue swelling.

## 2021-12-03 DIAGNOSIS — M25511 Pain in right shoulder: Secondary | ICD-10-CM | POA: Diagnosis not present

## 2021-12-05 DIAGNOSIS — M7581 Other shoulder lesions, right shoulder: Secondary | ICD-10-CM | POA: Diagnosis not present

## 2022-01-04 IMAGING — US US ABDOMEN LIMITED
1 series · 14 of 25 positions shown · non-contrast
Comparison: CT Chest, Abdomen, and Pelvis today are reported
separately. 01/13/2015

CLINICAL DATA: 34-year-old male with abnormal LFTs.

EXAM:
ULTRASOUND ABDOMEN LIMITED RIGHT UPPER QUADRANT

[Series 1: us abdomen limited ruq · 14 of 42 slices shown]
[im 1/42]
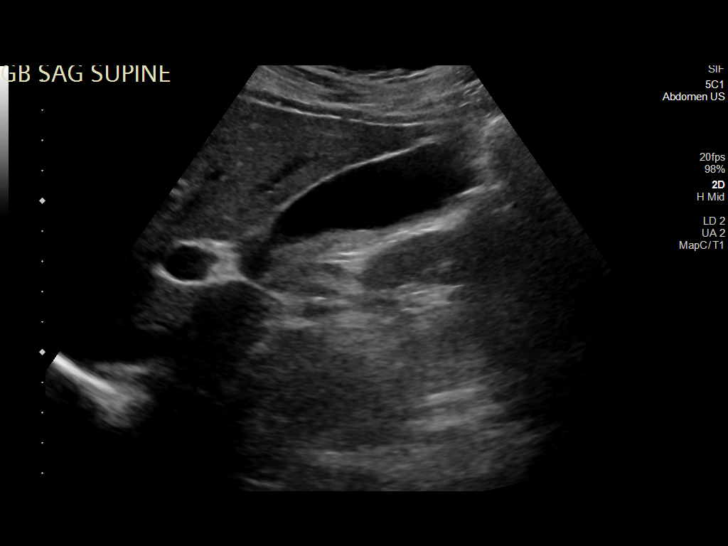
[im 4/42]
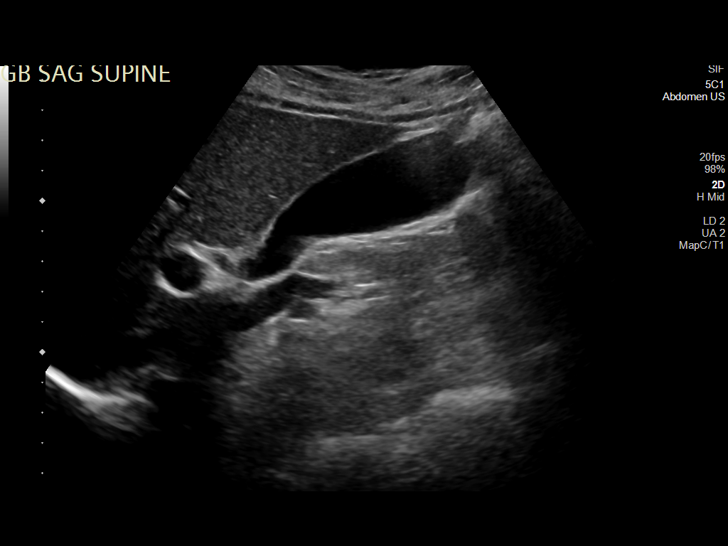
[im 7/42]
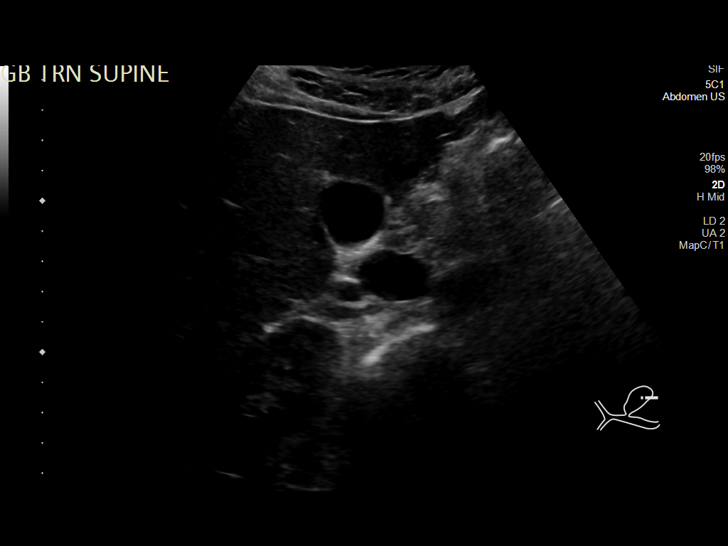
[im 11/42]
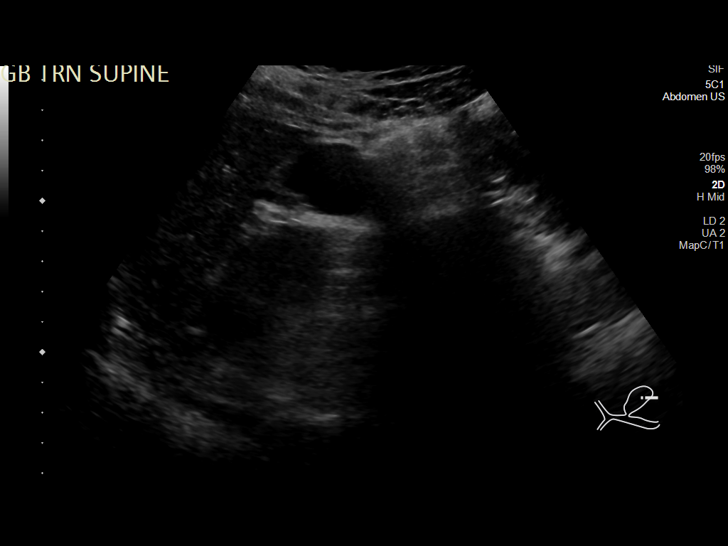
[im 14/42]
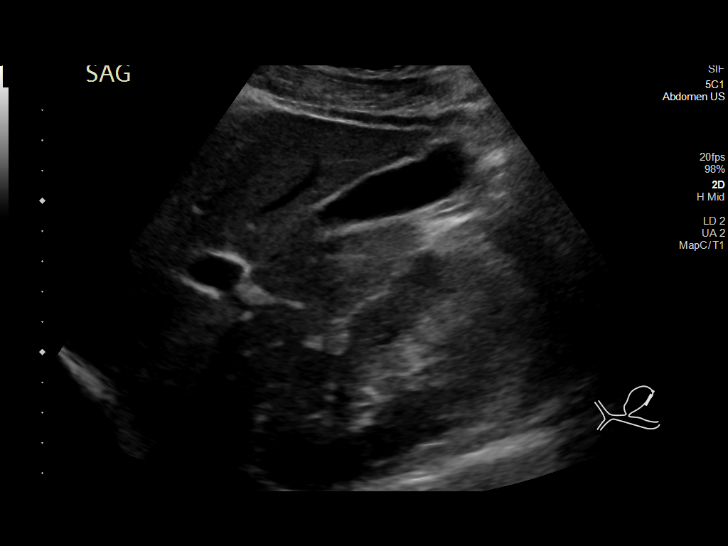
[im 16/42]
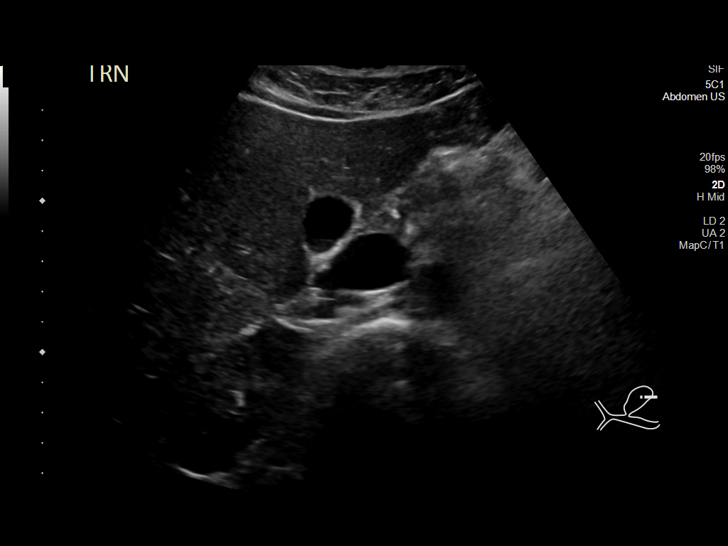
[im 19/42]
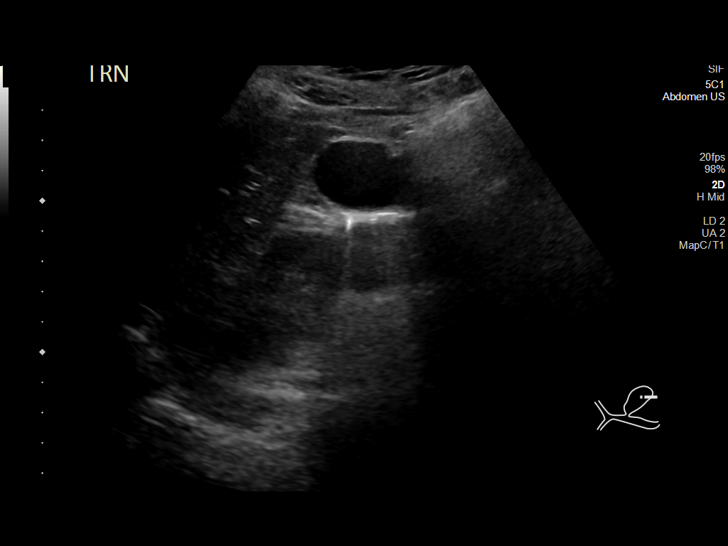
[im 23/42]
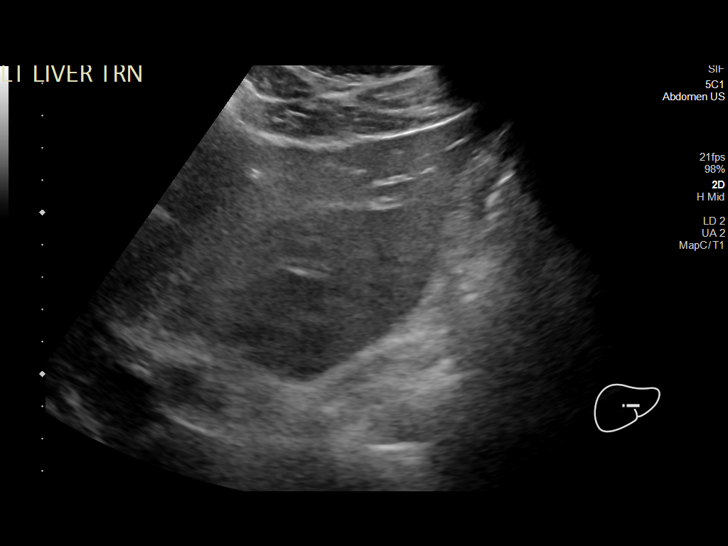
[im 26/42]
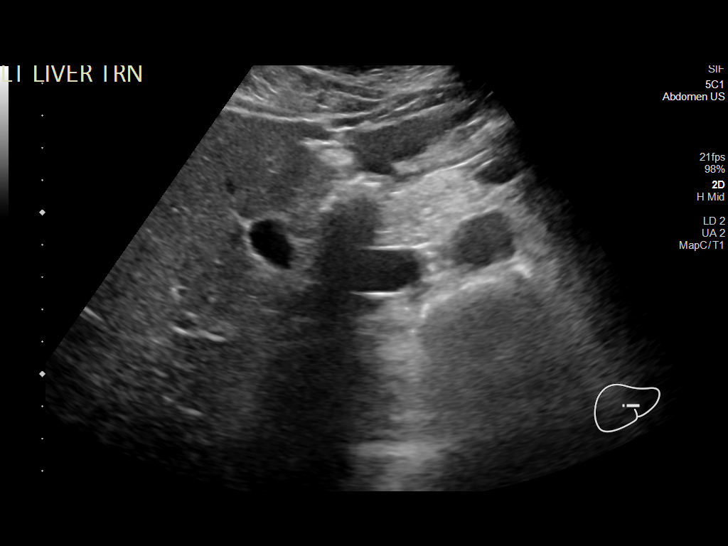
[im 28/42]
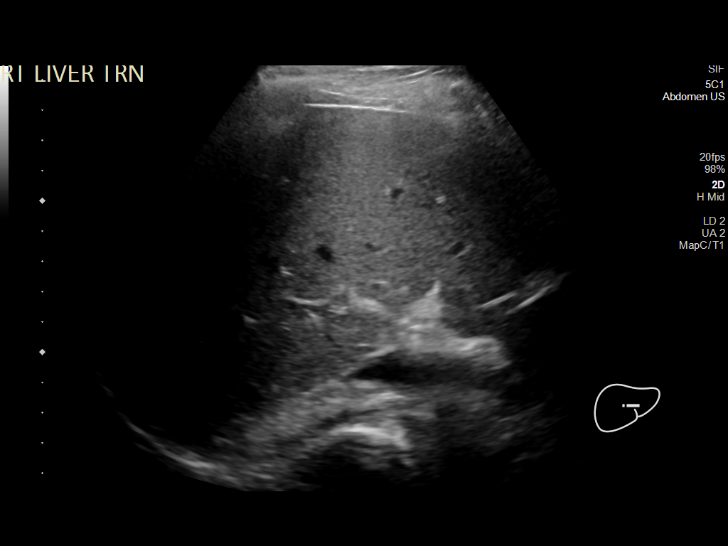
[im 31/42]
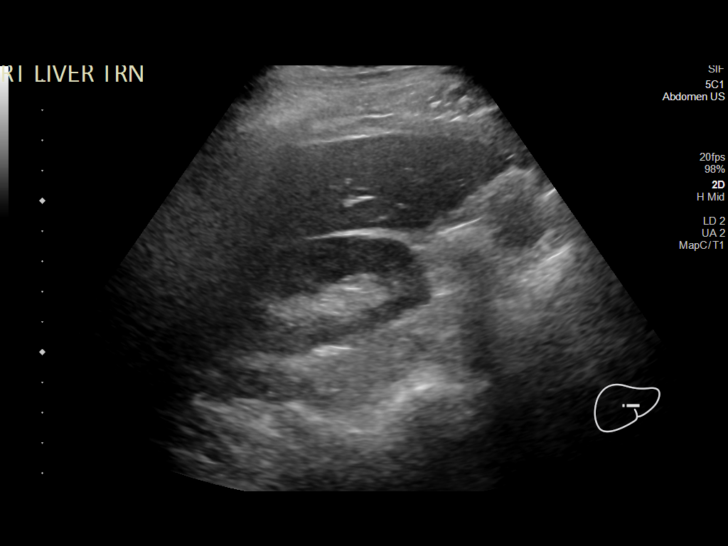
[im 35/42]
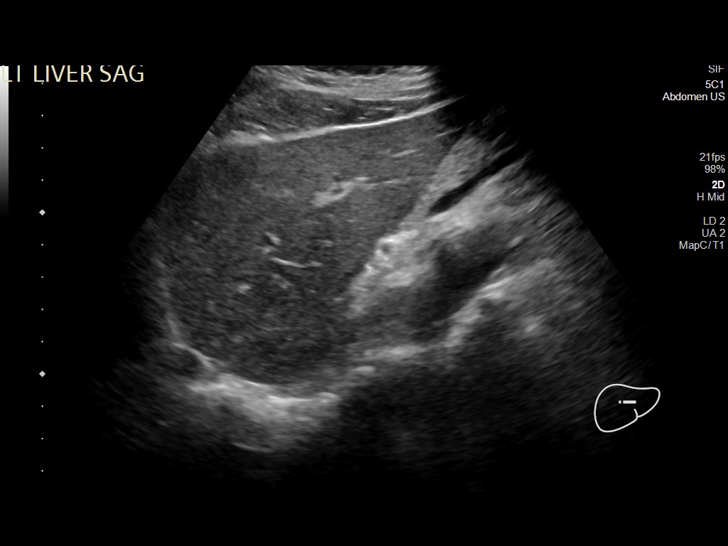
[im 38/42]
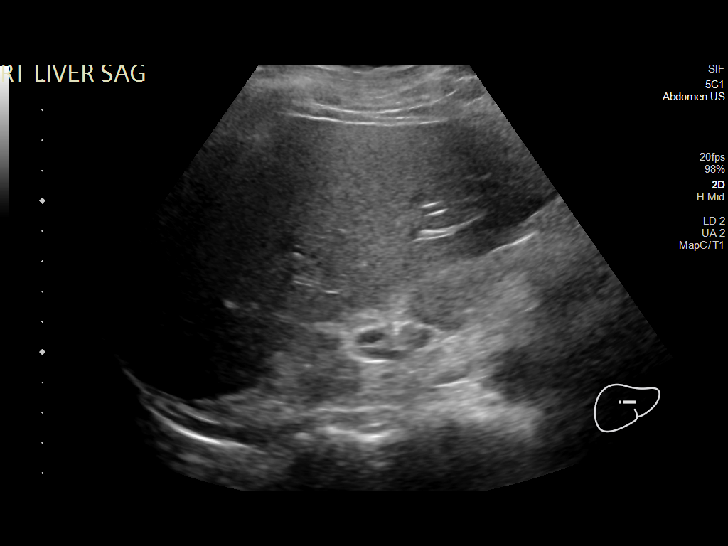
[im 42/42]
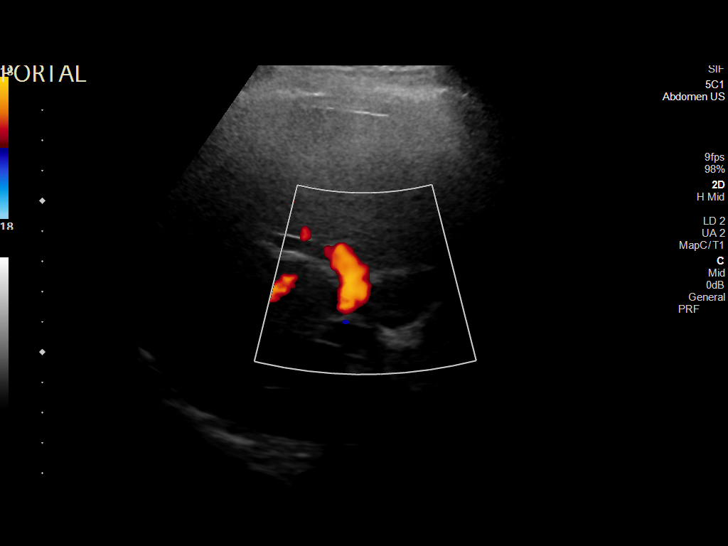

[14 of 25 positions shown; findings below may reference images not displayed]

FINDINGS: Gallbladder:

No gallstones or wall thickening visualized. No sonographic Murphy
sign noted by sonographer.

Common bile duct:

Diameter: 4 mm, normal.

Liver:

Liver echogenicity within normal limits (image 40). No discrete
liver lesion. No intrahepatic biliary ductal dilatation. Portal vein
is patent on color Doppler imaging with normal direction of blood
flow towards the liver.

Other: Normal visible right kidney. No right upper quadrant free
fluid.
IMPRESSION: Normal right upper quadrant ultrasound.

## 2022-10-07 ENCOUNTER — Ambulatory Visit (INDEPENDENT_AMBULATORY_CARE_PROVIDER_SITE_OTHER): Payer: BC Managed Care – PPO | Admitting: Nurse Practitioner

## 2022-10-07 ENCOUNTER — Encounter: Payer: Self-pay | Admitting: Nurse Practitioner

## 2022-10-07 VITALS — BP 200/120 | HR 70 | Temp 97.8°F | Ht 70.0 in | Wt 195.6 lb

## 2022-10-07 DIAGNOSIS — R519 Headache, unspecified: Secondary | ICD-10-CM | POA: Diagnosis not present

## 2022-10-07 DIAGNOSIS — R739 Hyperglycemia, unspecified: Secondary | ICD-10-CM

## 2022-10-07 DIAGNOSIS — G8929 Other chronic pain: Secondary | ICD-10-CM | POA: Diagnosis not present

## 2022-10-07 DIAGNOSIS — I160 Hypertensive urgency: Secondary | ICD-10-CM | POA: Diagnosis not present

## 2022-10-07 DIAGNOSIS — Z136 Encounter for screening for cardiovascular disorders: Secondary | ICD-10-CM

## 2022-10-07 DIAGNOSIS — Z0001 Encounter for general adult medical examination with abnormal findings: Secondary | ICD-10-CM | POA: Diagnosis not present

## 2022-10-07 DIAGNOSIS — Z13220 Encounter for screening for lipoid disorders: Secondary | ICD-10-CM

## 2022-10-07 DIAGNOSIS — Z23 Encounter for immunization: Secondary | ICD-10-CM | POA: Diagnosis not present

## 2022-10-07 MED ORDER — VALSARTAN 160 MG PO TABS: 160.0000 mg | ORAL_TABLET | ORAL | 1 refills | Status: DC

## 2022-10-07 MED ORDER — AMLODIPINE BESYLATE 10 MG PO TABS: 10.0000 mg | ORAL_TABLET | Freq: Every evening | ORAL | 3 refills | Status: DC

## 2022-10-07 NOTE — Assessment & Plan Note (Signed)
chronic, intermittent, related to caffeine withdrawal: drink 1energy drink (160mg  of caffeine) per day except weekends. Headache worse on weekend. No other symptom associated with headache Use of tylenol with significant relief

## 2022-10-07 NOTE — Progress Notes (Signed)
Complete physical exam  Patient: Noah Romero   DOB: [birth year]   36 y.o. Male  MRN: [id number] Visit Date: 10/07/2022  Subjective:    Chief Complaint  Patient presents with   Annual Exam    CPE Pt not fasting  C/o headaches  Flu & tdap given today  Needs refill on inhaler    Noah Romero is a 36 y.o. male who presents today for a complete physical exam. He reports consuming a general diet.  Stays active at worl, walking and weight training  He generally feels well. He reports sleeping well. He does have additional problems to discuss today.  Vision:No Dental:Yes STD Screen:No PSA:No  Most recent fall risk assessment:    07/31/2020   10:15 AM  Wilbur in the past year? 0  Number falls in past yr: 0  Injury with Fall? 0   Most recent depression screenings:    10/07/2022    2:02 PM 07/31/2020   10:15 AM  PHQ 2/9 Scores  PHQ - 2 Score 1 0  PHQ- 9 Score 5    HPI  Essential hypertension Uncontrolled due to lack of medication x 1year. BP Readings from Last 3 Encounters:  10/07/22 (!) 200/120  07/31/20 132/88  07/14/20 (!) 142/88   Resume amlodipine and valsartan Advised about CVA and MI symptoms and provided ED precautions Advised to maintain DASH diet, avoid any strenuous activity, use of ETOH and nicotine products. ECG: NSR with possible enlarged left atrium. F/up in 2weeks.  Headache chronic, intermittent, related to caffeine withdrawal: drink 1energy drink (160mg  of caffeine) per day except weekends. Headache worse on weekend. No other symptom associated with headache Use of tylenol with significant relief   Past Medical History:  Diagnosis Date   Allergy    Asthma    Elevated LFTs 07/06/2020   Past Surgical History:  Procedure Laterality Date   NO PAST SURGERIES     Social History   Socioeconomic History   Marital status: Married    Spouse name: Not on file   Number of children: 2   Years of education: Not on file   Highest education  level: Not on file  Occupational History   Occupation: Chief Operating Officer , college    Employer: [employer]  Tobacco Use   Smoking status: Some Days    Types: Cigarettes, Cigars   Smokeless tobacco: Never  Vaping Use   Vaping Use: Every day   Substances: Nicotine, Flavoring  Substance and Sexual Activity   Alcohol use: Yes    Alcohol/week: 14.0 standard drinks of alcohol    Types: 14 Cans of beer per week   Drug use: No   Sexual activity: Yes    Birth control/protection: None  Other Topics Concern   Not on file  Social History Narrative   Not on file   Social Determinants of Health   Financial Resource Strain: Not on file  Food Insecurity: Not on file  Transportation Needs: Not on file  Physical Activity: Not on file  Stress: Not on file  Social Connections: Not on file  Intimate Partner Violence: Not on file   Family Status  Relation Name Status   Mother  Alive   Father  Alive   Sister  Alive   MGM  Alive   PGF  Deceased   MGF  Deceased   Neg Hx  (Not Specified)   Family History  Problem Relation Age of Onset   Diabetes Mother  Hyperlipidemia Father    Hypertension Father    Birth defects Paternal Grandfather    Cancer Paternal Grandfather 69       Prostate   Hearing loss Maternal Grandfather 56       CAD with MI, cause of death   Colon cancer Neg Hx    No Known Allergies  Patient Care Team: Alveda Vanhorne, Charlene Brooke, NP as PCP - General (Internal Medicine)   Medications: Outpatient Medications Prior to Visit  Medication Sig   Multiple Vitamin (MULTIVITAMIN WITH MINERALS) TABS tablet Take 1 tablet by mouth daily.   Omega-3 Fatty Acids (FISH OIL) 1000 MG CAPS Take 1 capsule by mouth daily.   [DISCONTINUED] albuterol (PROAIR HFA) 108 (90 Base) MCG/ACT inhaler Inhale 1-2 puffs into the lungs every 6 (six) hours as needed for wheezing or shortness of breath. (Patient not taking: Reported on 10/07/2022)   [DISCONTINUED] amLODipine (NORVASC) 10 MG tablet Take 1 tablet (10  mg total) by mouth daily.   [DISCONTINUED] Cholecalciferol (VITAMIN D3 PO) Take 1 capsule by mouth daily. (Patient not taking: Reported on 10/07/2022)   No facility-administered medications prior to visit.   Review of Systems  Constitutional:  Negative for fever.  HENT:  Negative for congestion and sore throat.   Eyes:        Negative for visual changes  Respiratory:  Negative for apnea, cough and shortness of breath.   Cardiovascular:  Negative for chest pain, palpitations and leg swelling.  Gastrointestinal:  Negative for blood in stool, constipation and diarrhea.  Genitourinary:  Negative for dysuria, frequency and urgency.  Musculoskeletal:  Negative for myalgias.  Skin:  Negative for rash.  Neurological:  Positive for headaches. Negative for dizziness, weakness, light-headedness and numbness.  Hematological:  Does not bruise/bleed easily.  Psychiatric/Behavioral:  Negative for sleep disturbance and suicidal ideas. The patient is not nervous/anxious.        Objective:  BP (!) 200/120   Pulse 70   Temp 97.8 F (36.6 C) (Temporal)   Ht 5\' 10"  (1.778 m)   Wt 195 lb 9.6 oz (88.7 kg)   SpO2 97%   BMI 28.07 kg/m    Wt Readings from Last 3 Encounters:  10/07/22 195 lb 9.6 oz (88.7 kg)  07/31/20 173 lb 9.6 oz (78.7 kg)  07/14/20 169 lb 6.4 oz (76.8 kg)    Physical Exam Vitals and nursing note reviewed.  Constitutional:      General: He is not in acute distress.    Appearance: He is well-developed.  HENT:     Right Ear: Tympanic membrane, ear canal and external ear normal.     Left Ear: Tympanic membrane, ear canal and external ear normal.     Nose: Nose normal.  Eyes:     Extraocular Movements: Extraocular movements intact.     Conjunctiva/sclera: Conjunctivae normal.  Cardiovascular:     Rate and Rhythm: Normal rate and regular rhythm.     Pulses: Normal pulses.     Heart sounds: Normal heart sounds.  Pulmonary:     Effort: Pulmonary effort is normal. No  respiratory distress.     Breath sounds: Normal breath sounds.  Chest:     Chest wall: No tenderness.  Abdominal:     General: Bowel sounds are normal.     Palpations: Abdomen is soft.  Musculoskeletal:        General: Normal range of motion.     Cervical back: Normal range of motion and neck supple.  Right lower leg: No edema.     Left lower leg: No edema.  Lymphadenopathy:     Cervical: No cervical adenopathy.  Skin:    General: Skin is warm and dry.  Neurological:     Mental Status: He is alert and oriented to person, place, and time.     Deep Tendon Reflexes: Reflexes are normal and symmetric.  Psychiatric:        Mood and Affect: Mood normal.        Behavior: Behavior normal.        Thought Content: Thought content normal.      No results found for any visits on 10/07/22.    Assessment & Plan:    Routine Health Maintenance and Physical Exam  Immunization History  Administered Date(s) Administered   Influenza,inj,Quad PF,6+ Mos 09/21/2013, 01/03/2016, 08/24/2019, 10/07/2022   PFIZER(Purple Top)SARS-COV-2 Vaccination 03/17/2020, 04/13/2020, 11/23/2020, 09/06/2021   Tdap 10/07/2022    Health Maintenance  Topic Date Due   COVID-19 Vaccine (5 - Pfizer series) 10/23/2022 (Originally 11/01/2021)   TETANUS/TDAP  10/07/2032   INFLUENZA VACCINE  Completed   Hepatitis C Screening  Completed   HIV Screening  Completed   HPV VACCINES  Aged Out    Discussed health benefits of physical activity, and encouraged him to engage in regular exercise appropriate for his age and condition.  Problem List Items Addressed This Visit       Cardiovascular and Mediastinum   Essential hypertension    Uncontrolled due to lack of medication x 1year. BP Readings from Last 3 Encounters:  10/07/22 (!) 200/120  07/31/20 132/88  07/14/20 (!) 142/88   Resume amlodipine and valsartan Advised about CVA and MI symptoms and provided ED precautions Advised to maintain DASH diet, avoid  any strenuous activity, use of ETOH and nicotine products. ECG: NSR with possible enlarged left atrium. F/up in 2weeks.      Relevant Medications   valsartan (DIOVAN) 160 MG tablet   amLODipine (NORVASC) 10 MG tablet     Other   Headache    chronic, intermittent, related to caffeine withdrawal: drink 1energy drink (160mg  of caffeine) per day except weekends. Headache worse on weekend. No other symptom associated with headache Use of tylenol with significant relief        Relevant Medications   amLODipine (NORVASC) 10 MG tablet   Other Visit Diagnoses     Encounter for preventative adult health care exam with abnormal findings    -  Primary   Relevant Orders   CBC   Comprehensive metabolic panel   Hyperglycemia       Relevant Orders   Hemoglobin A1c   Encounter for lipid screening for cardiovascular disease       Relevant Orders   Lipid panel   Need for Tdap vaccination       Relevant Orders   Tdap vaccine greater than or equal to 7yo IM (Completed)   Need for immunization against influenza       Relevant Orders   Flu Vaccine QUAD 6+ mos PF IM (Fluarix Quad PF) (Completed)      Return in about 2 weeks (around 10/21/2022) for HTN.     Wilfred Lacy, NP

## 2022-10-07 NOTE — Patient Instructions (Addendum)
Resume Amlodipine in PM Start valsartan in AM Take first doses at same time today Avoid all strenuous activities and nicotine products Maintain low sodium diet Go to lab F/up in 2weeks  Managing Your Hypertension Hypertension, also called high blood pressure, is when the force of the blood pressing against the walls of the arteries is too strong. Arteries are blood vessels that carry blood from your heart throughout your body. Hypertension forces the heart to work harder to pump blood and may cause the arteries to become narrow or stiff. Understanding blood pressure readings A blood pressure reading includes a higher number over a lower number: The first, or top, number is called the systolic pressure. It is a measure of the pressure in your arteries as your heart beats. The second, or bottom number, is called the diastolic pressure. It is a measure of the pressure in your arteries as the heart relaxes. For most people, a normal blood pressure is below 120/80. Your personal target blood pressure may vary depending on your medical conditions, your age, and other factors. Blood pressure is classified into four stages. Based on your blood pressure reading, your health care provider may use the following stages to determine what type of treatment you need, if any. Systolic pressure and diastolic pressure are measured in a unit called millimeters of mercury (mmHg). Normal Systolic pressure: below 123456. Diastolic pressure: below 80. Elevated Systolic pressure: [id number]. Diastolic pressure: below 80. Hypertension stage 1 Systolic pressure: 0000000. Diastolic pressure: [id number]. Hypertension stage 2 Systolic pressure: [id number] or above. Diastolic pressure: 90 or above. How can this condition affect me? Managing your hypertension is very important. Over time, hypertension can damage the arteries and decrease blood flow to parts of the body, including the brain, heart, and kidneys. Having untreated or  uncontrolled hypertension can lead to: A heart attack. A stroke. A weakened blood vessel (aneurysm). Heart failure. Kidney damage. Eye damage. Memory and concentration problems. Vascular dementia. What actions can I take to manage this condition? Hypertension can be managed by making lifestyle changes and possibly by taking medicines. Your health care provider will help you make a plan to bring your blood pressure within a normal range. You may be referred for counseling on a healthy diet and physical activity. Nutrition  Eat a diet that is high in fiber and potassium, and low in salt (sodium), added sugar, and fat. An example eating plan is called the DASH diet. DASH stands for Dietary Approaches to Stop Hypertension. To eat this way: Eat plenty of fresh fruits and vegetables. Try to fill one-half of your plate at each meal with fruits and vegetables. Eat whole grains, such as whole-wheat pasta, brown rice, or whole-grain bread. Fill about one-fourth of your plate with whole grains. Eat low-fat dairy products. Avoid fatty cuts of meat, processed or cured meats, and poultry with skin. Fill about one-fourth of your plate with lean proteins such as fish, chicken without skin, beans, eggs, and tofu. Avoid pre-made and processed foods. These tend to be higher in sodium, added sugar, and fat. Reduce your daily sodium intake. Many people with hypertension should eat less than 1,500 mg of sodium a day. Lifestyle  Work with your health care provider to maintain a healthy body weight or to lose weight. Ask what an ideal weight is for you. Get at least 30 minutes of exercise that causes your heart to beat faster (aerobic exercise) most days of the week. Activities may include walking, swimming, or biking. Include exercise to  strengthen your muscles (resistance exercise), such as weight lifting, as part of your weekly exercise routine. Try to do these types of exercises for 30 minutes at least 3 days a  week. Do not use any products that contain nicotine or tobacco. These products include cigarettes, chewing tobacco, and vaping devices, such as e-cigarettes. If you need help quitting, ask your health care provider. Control any long-term (chronic) conditions you have, such as high cholesterol or diabetes. Identify your sources of stress and find ways to manage stress. This may include meditation, deep breathing, or making time for fun activities. Alcohol use Do not drink alcohol if: Your health care provider tells you not to drink. You are pregnant, may be pregnant, or are planning to become pregnant. If you drink alcohol: Limit how much you have to: 0-1 drink a day for women. 0-2 drinks a day for men. Know how much alcohol is in your drink. In the [place] one drink equals one 12 oz bottle of beer (355 mL), one 5 oz glass of wine (148 mL), or one 1 oz glass of hard liquor (44 mL). Medicines Your health care provider may prescribe medicine if lifestyle changes are not enough to get your blood pressure under control and if: Your systolic blood pressure is 130 or higher. Your diastolic blood pressure is 80 or higher. Take medicines only as told by your health care provider. Follow the directions carefully. Blood pressure medicines must be taken as told by your health care provider. The medicine does not work as well when you skip doses. Skipping doses also puts you at risk for problems. Monitoring Before you monitor your blood pressure: Do not smoke, drink caffeinated beverages, or exercise within 30 minutes before taking a measurement. Use the bathroom and empty your bladder (urinate). Sit quietly for at least 5 minutes before taking measurements. Monitor your blood pressure at home as told by your health care provider. To do this: Sit with your back straight and supported. Place your feet flat on the floor. Do not cross your legs. Support your arm on a flat surface, such as a table. Make sure  your upper arm is at heart level. Each time you measure, take two or three readings one minute apart and record the results. You may also need to have your blood pressure checked regularly by your health care provider. General information Talk with your health care provider about your diet, exercise habits, and other lifestyle factors that may be contributing to hypertension. Review all the medicines you take with your health care provider because there may be side effects or interactions. Keep all follow-up visits. Your health care provider can help you create and adjust your plan for managing your high blood pressure. Where to find more information National Heart, Lung, and Blood Institute: https://wilson-eaton.com/ American Heart Association: www.heart.org Contact a health care provider if: You think you are having a reaction to medicines you have taken. You have repeated (recurrent) headaches. You feel dizzy. You have swelling in your ankles. You have trouble with your vision. Get help right away if: You develop a severe headache or confusion. You have unusual weakness or numbness, or you feel faint. You have severe pain in your chest or abdomen. You vomit repeatedly. You have trouble breathing. These symptoms may be an emergency. Get help right away. Call 911. Do not wait to see if the symptoms will go away. Do not drive yourself to the hospital. Summary Hypertension is when the force of blood pumping  through your arteries is too strong. If this condition is not controlled, it may put you at risk for serious complications. Your personal target blood pressure may vary depending on your medical conditions, your age, and other factors. For most people, a normal blood pressure is less than 120/80. Hypertension is managed by lifestyle changes, medicines, or both. Lifestyle changes to help manage hypertension include losing weight, eating a healthy, low-sodium diet, exercising more, stopping  smoking, and limiting alcohol. This information is not intended to replace advice given to you by your health care provider. Make sure you discuss any questions you have with your health care provider. Document Revised: 08/23/2021 Document Reviewed: 08/23/2021 Elsevier Patient Education  [place].

## 2022-10-07 NOTE — Assessment & Plan Note (Addendum)
Uncontrolled due to lack of medication x 1year. BP Readings from Last 3 Encounters:  10/07/22 (!) 200/120  07/31/20 132/88  07/14/20 (!) 142/88   Resume amlodipine and valsartan Advised about CVA and MI symptoms and provided ED precautions Advised to maintain DASH diet, avoid any strenuous activity, use of ETOH and nicotine products. ECG: NSR with possible enlarged left atrium. F/up in 2weeks.

## 2022-10-08 LAB — COMPREHENSIVE METABOLIC PANEL
ALT: 48 U/L (ref 0–53)
AST: 30 U/L (ref 0–37)
Albumin: 4.8 g/dL (ref 3.5–5.2)
Alkaline Phosphatase: 68 U/L (ref 39–117)
BUN: 10 mg/dL (ref 6–23)
CO2: 28 mEq/L (ref 19–32)
Calcium: 9.8 mg/dL (ref 8.4–10.5)
Chloride: 99 mEq/L (ref 96–112)
Creatinine, Ser: 0.98 mg/dL (ref 0.40–1.50)
GFR: 99.93 mL/min (ref >60.00)
Glucose, Bld: 90 mg/dL (ref 70–99)
Potassium: 4.2 mEq/L (ref 3.5–5.1)
Sodium: 137 mEq/L (ref 135–145)
Total Bilirubin: 0.9 mg/dL (ref 0.2–1.2)
Total Protein: 8 g/dL (ref 6.0–8.3)

## 2022-10-08 LAB — CBC
HCT: 42.4 % (ref 39.0–52.0)
Hemoglobin: 14.2 g/dL (ref 13.0–17.0)
MCHC: 33.4 g/dL (ref 30.0–36.0)
MCV: 92.2 fl (ref 78.0–100.0)
Platelets: 312 10*3/uL (ref 150.0–400.0)
RBC: 4.6 Mil/uL (ref 4.22–5.81)
RDW: 13.4 % (ref 11.5–15.5)
WBC: 6.3 10*3/uL (ref 4.0–10.5)

## 2022-10-08 LAB — LIPID PANEL
Cholesterol: 177 mg/dL (ref 0–200)
HDL: 59.4 mg/dL (ref >39.00)
LDL Cholesterol: 101 mg/dL — ABNORMAL HIGH (ref 0–99)
NonHDL: 117.29
Total CHOL/HDL Ratio: 3
Triglycerides: 82 mg/dL (ref 0.0–149.0)
VLDL: 16.4 mg/dL (ref 0.0–40.0)

## 2022-10-08 LAB — HEMOGLOBIN A1C: Hgb A1c MFr Bld: 5.7 % (ref 4.6–6.5)

## 2022-10-08 LAB — TSH: TSH: 1.94 u[IU]/mL (ref 0.35–5.50)

## 2022-10-23 ENCOUNTER — Ambulatory Visit (INDEPENDENT_AMBULATORY_CARE_PROVIDER_SITE_OTHER): Payer: BC Managed Care – PPO | Admitting: Nurse Practitioner

## 2022-10-23 ENCOUNTER — Encounter: Payer: Self-pay | Admitting: Nurse Practitioner

## 2022-10-23 VITALS — BP 128/78 | HR 85 | Temp 96.6°F | Ht 70.0 in | Wt 190.6 lb

## 2022-10-23 DIAGNOSIS — I160 Hypertensive urgency: Secondary | ICD-10-CM

## 2022-10-23 DIAGNOSIS — J45909 Unspecified asthma, uncomplicated: Secondary | ICD-10-CM | POA: Diagnosis not present

## 2022-10-23 DIAGNOSIS — I10 Essential (primary) hypertension: Secondary | ICD-10-CM

## 2022-10-23 MED ORDER — ALBUTEROL SULFATE HFA 108 (90 BASE) MCG/ACT IN AERS: 1.0000 | INHALATION_SPRAY | Freq: Four times a day (QID) | RESPIRATORY_TRACT | 0 refills | Status: DC | PRN

## 2022-10-23 MED ORDER — VALSARTAN 160 MG PO TABS: 160.0000 mg | ORAL_TABLET | ORAL | 3 refills | Status: DC

## 2022-10-23 NOTE — Patient Instructions (Addendum)
Maintain current medications Call office if BP remains >140/80

## 2022-10-23 NOTE — Progress Notes (Signed)
Established Patient Visit  Patient: Noah Romero   DOB: 08-Mar-1986   36 y.o. Male  MRN: [id number] Visit Date: 10/23/2022  Subjective:    Chief Complaint  Patient presents with   Office Visit    HTN  Checks BP daily  No concerns    HPI Essential hypertension Improved BP control with medication and diet compliance BP Readings from Last 3 Encounters:  10/23/22 128/78  10/07/22 (!) 200/120  07/31/20 132/88   Maintain med doses F/up in 60months  Reviewed medical, surgical, and social history today  Medications: Outpatient Medications Prior to Visit  Medication Sig   amLODipine (NORVASC) 10 MG tablet Take 1 tablet (10 mg total) by mouth every evening.   Multiple Vitamin (MULTIVITAMIN WITH MINERALS) TABS tablet Take 1 tablet by mouth daily.   Omega-3 Fatty Acids (FISH OIL) 1000 MG CAPS Take 1 capsule by mouth daily.   [DISCONTINUED] valsartan (DIOVAN) 160 MG tablet Take 1 tablet (160 mg total) by mouth every morning.   No facility-administered medications prior to visit.   Reviewed past medical and social history.   ROS per HPI above  Last CBC Lab Results  Component Value Date   WBC 6.3 10/07/2022   HGB 14.2 10/07/2022   HCT 42.4 10/07/2022   MCV 92.2 10/07/2022   MCH 31.0 07/06/2020   RDW 13.4 10/07/2022   PLT 312.0 10/07/2022   Last metabolic panel Lab Results  Component Value Date   GLUCOSE 90 10/07/2022   NA 137 10/07/2022   K 4.2 10/07/2022   CL 99 10/07/2022   CO2 28 10/07/2022   BUN 10 10/07/2022   CREATININE 0.98 10/07/2022   GFRNONAA >60 07/06/2020   CALCIUM 9.8 10/07/2022   PROT 8.0 10/07/2022   ALBUMIN 4.8 10/07/2022   BILITOT 0.9 10/07/2022   ALKPHOS 68 10/07/2022   AST 30 10/07/2022   ALT 48 10/07/2022   ANIONGAP 11 07/06/2020   Last lipids Lab Results  Component Value Date   CHOL 177 10/07/2022   HDL 59.40 10/07/2022   LDLCALC 101 (H) 10/07/2022   TRIG 82.0 10/07/2022   CHOLHDL 3 10/07/2022   Last hemoglobin  A1c Lab Results  Component Value Date   HGBA1C 5.7 10/07/2022   Last thyroid functions Lab Results  Component Value Date   TSH 1.94 10/07/2022        Objective:  BP 128/78   Pulse 85   Temp (!) 96.6 F (35.9 C) (Temporal)   Ht 5\' 10"  (1.778 m)   Wt 190 lb 9.6 oz (86.5 kg)   SpO2 98%   BMI 27.35 kg/m      Physical Exam Cardiovascular:     Rate and Rhythm: Normal rate.     Pulses: Normal pulses.  Pulmonary:     Effort: Pulmonary effort is normal.  Neurological:     Mental Status: He is alert and oriented to person, place, and time.     No results found for any visits on 10/23/22.    Assessment & Plan:    Problem List Items Addressed This Visit       Cardiovascular and Mediastinum   Essential hypertension    Improved BP control with medication and diet compliance BP Readings from Last 3 Encounters:  10/23/22 128/78  10/07/22 (!) 200/120  07/31/20 132/88   Maintain med doses F/up in 32months      Relevant Medications   valsartan (DIOVAN) 160  MG tablet     Respiratory   Intrinsic asthma - Primary   Relevant Medications   albuterol (VENTOLIN HFA) 108 (90 Base) MCG/ACT inhaler   Other Visit Diagnoses     Hypertensive urgency       Relevant Medications   valsartan (DIOVAN) 160 MG tablet      Return in about 6 months (around 04/23/2023) for HTN, hyperlipidemia (fasting).     Wilfred Lacy, NP

## 2022-10-23 NOTE — Assessment & Plan Note (Signed)
Improved BP control with medication and diet compliance BP Readings from Last 3 Encounters:  10/23/22 128/78  10/07/22 (!) 200/120  07/31/20 132/88   Maintain med doses F/up in 71months

## 2023-04-07 ENCOUNTER — Encounter: Payer: Self-pay | Admitting: *Deleted

## 2023-04-10 ENCOUNTER — Other Ambulatory Visit: Payer: Self-pay | Admitting: Nurse Practitioner

## 2023-04-10 DIAGNOSIS — J45909 Unspecified asthma, uncomplicated: Secondary | ICD-10-CM

## 2023-05-12 ENCOUNTER — Ambulatory Visit: Payer: Commercial Managed Care - PPO | Admitting: Nurse Practitioner

## 2023-05-12 ENCOUNTER — Encounter: Payer: Self-pay | Admitting: Nurse Practitioner

## 2023-05-12 VITALS — BP 130/90 | HR 66 | Temp 98.1°F | Resp 16 | Ht 70.0 in | Wt 190.2 lb

## 2023-05-12 DIAGNOSIS — R400 Somnolence: Secondary | ICD-10-CM | POA: Diagnosis not present

## 2023-05-12 DIAGNOSIS — J45909 Unspecified asthma, uncomplicated: Secondary | ICD-10-CM | POA: Diagnosis not present

## 2023-05-12 DIAGNOSIS — I10 Essential (primary) hypertension: Secondary | ICD-10-CM

## 2023-05-12 DIAGNOSIS — R0683 Snoring: Secondary | ICD-10-CM | POA: Diagnosis not present

## 2023-05-12 MED ORDER — FLUTICASONE PROPIONATE HFA 110 MCG/ACT IN AERO: 1.0000 | INHALATION_SPRAY | Freq: Two times a day (BID) | RESPIRATORY_TRACT | 5 refills | Status: DC

## 2023-05-12 NOTE — Patient Instructions (Addendum)
Maintain current meds Call office if BP remains >130/80. Schedule appt with sleep clinic

## 2023-05-12 NOTE — Progress Notes (Signed)
Established Patient Visit  Patient: Noah Romero   DOB: [birth year]   37 y.o. Male  MRN: [id number] Visit Date: 05/12/2023  Subjective:    Chief Complaint  Patient presents with   Hypertension    Refill on Amlodipine and would like to talk about increasing the albuterol inhaler. He's been having chest tightness   HPI Intrinsic asthma Daily use of albuterol in last 10month due to chest tightness and wheezing Trigger: recent viral infection and pollen. No GERD or post nasal drainage or fever or  Symptoms are worse at HS.  Maintain use of SABA Add ICS-flovent for daily use x 2-6months. F/up prn  Essential hypertension Elevated BP today Reports compliance with diet and med dose. Home BP 130s/80s BP Readings from Last 3 Encounters:  05/12/23 (!) 130/90  10/23/22 128/78  10/07/22 (!) 200/120    Maintain DASH diet, amlodipine and valsartan dose, and resume daily exercise. Monitor BP at home daily in AM F/up in 6months or sooner if home BP remains> 130/80  Uncontrolled daytime somnolence Onset 1year ago, associated with loud snoring per wife, also has sleepiness after lunch, easily falls asleep if he sits quietly watching TV or reading. No apneic episodes, no Am headache. ETOH use: 2beers daily.  Advised to avoid driving if sleepy and minimize ETOH use Entered referral to sleep clinic  Reviewed medical, surgical, and social history today  Medications: Outpatient Medications Prior to Visit  Medication Sig   albuterol (VENTOLIN HFA) 108 (90 Base) MCG/ACT inhaler Inhale 1-2 puffs into the lungs every 6 (six) hours as needed for wheezing or shortness of breath.   amLODipine (NORVASC) 10 MG tablet Take 1 tablet (10 mg total) by mouth every evening.   Multiple Vitamin (MULTIVITAMIN WITH MINERALS) TABS tablet Take 1 tablet by mouth daily.   Omega-3 Fatty Acids (FISH OIL) 1000 MG CAPS Take 1 capsule by mouth daily.   valsartan (DIOVAN) 160 MG tablet Take 1 tablet (160  mg total) by mouth every morning.   No facility-administered medications prior to visit.   Reviewed past medical and social history.   ROS per HPI above      Objective:  BP (!) 130/90 (BP Location: Right Arm, Patient Position: Sitting, Cuff Size: Large)   Pulse 66   Temp 98.1 F (36.7 C) (Temporal)   Resp 16   Ht 5\' 10"  (1.778 m)   Wt 190 lb 3.2 oz (86.3 kg)   SpO2 98%   BMI 27.29 kg/m      Physical Exam Vitals reviewed.  Cardiovascular:     Rate and Rhythm: Normal rate and regular rhythm.     Pulses: Normal pulses.     Heart sounds: Normal heart sounds.  Pulmonary:     Effort: Pulmonary effort is normal.     Breath sounds: Normal breath sounds.  Musculoskeletal:     Right lower leg: No edema.     Left lower leg: No edema.  Neurological:     Mental Status: He is alert and oriented to person, place, and time.     No results found for any visits on 05/12/23.    Assessment & Plan:    Problem List Items Addressed This Visit       Cardiovascular and Mediastinum   Essential hypertension - Primary    Elevated BP today Reports compliance with diet and med dose. Home BP 130s/80s BP Readings from Last 3 Encounters:  05/12/23 (!) 130/90  10/23/22 128/78  10/07/22 (!) 200/120    Maintain DASH diet, amlodipine and valsartan dose, and resume daily exercise. Monitor BP at home daily in AM F/up in 6months or sooner if home BP remains> 130/80      Relevant Orders   Ambulatory referral to Sleep Studies     Respiratory   Intrinsic asthma    Daily use of albuterol in last 69month due to chest tightness and wheezing Trigger: recent viral infection and pollen. No GERD or post nasal drainage or fever or  Symptoms are worse at HS.  Maintain use of SABA Add ICS-flovent for daily use x 2-469months. F/up prn      Relevant Medications   fluticasone (FLOVENT HFA) 110 MCG/ACT inhaler     Other   Uncontrolled daytime somnolence    Onset 1year ago, associated with loud  snoring per wife, also has sleepiness after lunch, easily falls asleep if he sits quietly watching TV or reading. No apneic episodes, no Am headache. ETOH use: 2beers daily.  Advised to avoid driving if sleepy and minimize ETOH use Entered referral to sleep clinic      Relevant Orders   Ambulatory referral to Sleep Studies   Other Visit Diagnoses     Loud snoring       Relevant Orders   Ambulatory referral to Sleep Studies      Return in about 6 months (around 11/12/2023) for CPE (fasting).     Alysia Penna, NP

## 2023-05-12 NOTE — Assessment & Plan Note (Signed)
Daily use of albuterol in last 56month due to chest tightness and wheezing Trigger: recent viral infection and pollen. No GERD or post nasal drainage or fever or  Symptoms are worse at HS.  Maintain use of SABA Add ICS-flovent for daily use x 2-356months. F/up prn

## 2023-05-12 NOTE — Assessment & Plan Note (Addendum)
Elevated BP today Reports compliance with diet and med dose. Home BP 130s/80s BP Readings from Last 3 Encounters:  05/12/23 (!) 130/90  10/23/22 128/78  10/07/22 (!) 200/120    Maintain DASH diet, amlodipine and valsartan dose, and resume daily exercise. Monitor BP at home daily in AM F/up in 6months or sooner if home BP remains> 130/80

## 2023-05-12 NOTE — Assessment & Plan Note (Signed)
Onset 1year ago, associated with loud snoring per wife, also has sleepiness after lunch, easily falls asleep if he sits quietly watching TV or reading. No apneic episodes, no Am headache. ETOH use: 2beers daily.  Advised to avoid driving if sleepy and minimize ETOH use Entered referral to sleep clinic

## 2023-06-17 ENCOUNTER — Ambulatory Visit: Payer: Commercial Managed Care - PPO | Admitting: Neurology

## 2023-06-17 ENCOUNTER — Encounter: Payer: Self-pay | Admitting: Neurology

## 2023-06-17 VITALS — BP 133/88 | HR 62 | Ht 70.0 in | Wt 186.0 lb

## 2023-06-17 DIAGNOSIS — E663 Overweight: Secondary | ICD-10-CM | POA: Diagnosis not present

## 2023-06-17 DIAGNOSIS — Z9189 Other specified personal risk factors, not elsewhere classified: Secondary | ICD-10-CM | POA: Diagnosis not present

## 2023-06-17 DIAGNOSIS — R0683 Snoring: Secondary | ICD-10-CM

## 2023-06-17 DIAGNOSIS — R519 Headache, unspecified: Secondary | ICD-10-CM

## 2023-06-17 DIAGNOSIS — G4719 Other hypersomnia: Secondary | ICD-10-CM

## 2023-06-17 NOTE — Patient Instructions (Signed)
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
## 2023-07-24 ENCOUNTER — Telehealth: Payer: Self-pay | Admitting: Neurology

## 2023-07-24 NOTE — Telephone Encounter (Signed)
7/25:LVM-MLA 07/08/23 UMR no auth req ref # [id number]

## 2023-09-30 ENCOUNTER — Encounter: Payer: Self-pay | Admitting: Nurse Practitioner

## 2023-09-30 ENCOUNTER — Ambulatory Visit (INDEPENDENT_AMBULATORY_CARE_PROVIDER_SITE_OTHER): Payer: Commercial Managed Care - PPO | Admitting: Nurse Practitioner

## 2023-09-30 VITALS — BP 124/78 | HR 64 | Temp 98.3°F | Ht 70.0 in | Wt 187.6 lb

## 2023-09-30 DIAGNOSIS — R739 Hyperglycemia, unspecified: Secondary | ICD-10-CM | POA: Insufficient documentation

## 2023-09-30 DIAGNOSIS — Z23 Encounter for immunization: Secondary | ICD-10-CM | POA: Diagnosis not present

## 2023-09-30 DIAGNOSIS — Z0001 Encounter for general adult medical examination with abnormal findings: Secondary | ICD-10-CM

## 2023-09-30 DIAGNOSIS — E7800 Pure hypercholesterolemia, unspecified: Secondary | ICD-10-CM | POA: Diagnosis not present

## 2023-09-30 DIAGNOSIS — I10 Essential (primary) hypertension: Secondary | ICD-10-CM

## 2023-09-30 MED ORDER — AMLODIPINE BESYLATE 10 MG PO TABS: 10.0000 mg | ORAL_TABLET | Freq: Every evening | ORAL | 3 refills | Status: DC

## 2023-09-30 MED ORDER — VALSARTAN 160 MG PO TABS: 160.0000 mg | ORAL_TABLET | ORAL | 3 refills | Status: DC

## 2023-09-30 NOTE — Progress Notes (Signed)
Complete physical exam  Patient: Noah Romero   DOB: 1986-07-20   37 y.o. Male  MRN: [id number] Visit Date: 09/30/2023  Subjective:    Chief Complaint  Patient presents with   Annual Exam   LEFTY WOJICK is a 37 y.o. male who presents today for a complete physical exam. He reports consuming a general diet.  Rock climbing 1x/week  He generally feels well. He reports sleeping well. He does not have additional problems to discuss today.  Vision:No Dental:Yes STD Screen:No PSA:No BP Readings from Last 3 Encounters:  09/30/23 124/78  06/17/23 133/88  05/12/23 (!) 130/90   Wt Readings from Last 3 Encounters:  09/30/23 187 lb 9.6 oz (85.1 kg)  06/17/23 186 lb (84.4 kg)  05/12/23 190 lb 3.2 oz (86.3 kg)   Most recent fall risk assessment:    09/30/2023    2:08 PM  Fall Risk   Falls in the past year? 0  Number falls in past yr: 0  Injury with Fall? 0  Risk for fall due to : No Fall Risks  Follow up Falls prevention discussed   Depression screen:Yes - No Depression Most recent depression screenings:    09/30/2023    2:08 PM 05/12/2023    8:14 AM  PHQ 2/9 Scores  PHQ - 2 Score 2 0  PHQ- 9 Score 6    HPI  Elevated LDL cholesterol level Repeat lipid panel  Hyperglycemia Repeat hgbA1c  Essential hypertension BP at goal with losartan and amlodipine BP Readings from Last 3 Encounters:  09/30/23 124/78  06/17/23 133/88  05/12/23 (!) 130/90    Maintain med doses Repeat CMP F/up in 6-87months   Past Medical History:  Diagnosis Date   Allergy    Asthma    Elevated LFTs 07/06/2020   Past Surgical History:  Procedure Laterality Date   NO PAST SURGERIES     Social History   Socioeconomic History   Marital status: Married    Spouse name: Not on file   Number of children: 2   Years of education: Not on file   Highest education level: Not on file  Occupational History   Occupation: Leisure centre manager , college    Employer: [employer]  Tobacco Use   Smoking status:  Former    Types: Cigarettes, Cigars   Smokeless tobacco: Never   Tobacco comments:    No cigar in last 2 years, 1 cigarette in last 2 years. Occasionally nicotine pouch  Vaping Use   Vaping status: Former   Substances: Nicotine, Flavoring  Substance and Sexual Activity   Alcohol use: Yes    Alcohol/week: 14.0 standard drinks of alcohol    Types: 14 Cans of beer per week   Drug use: No   Sexual activity: Yes    Birth control/protection: None  Other Topics Concern   Not on file  Social History Narrative   Caffeine: 0 cups per day   Social Determinants of Health   Financial Resource Strain: Not on file  Food Insecurity: Not on file  Transportation Needs: Not on file  Physical Activity: Not on file  Stress: Not on file  Social Connections: Not on file  Intimate Partner Violence: Not on file   Family Status  Relation Name Status   Mother  Alive   Father  Alive   Sister  Alive   MGM  Deceased at age 43   MGF  Deceased   PGM  Deceased   PGF  Deceased at age  102   Neg Hx  (Not Specified)  No partnership data on file   Family History  Problem Relation Age of Onset   Diabetes Mother    Hyperlipidemia Father    Hypertension Father    Hearing loss Maternal Grandfather 53       CAD with MI, cause of death   Birth defects Paternal Grandfather    Cancer Paternal Grandfather 37       Prostate   Colon cancer Neg Hx    Sleep apnea Neg Hx    No Known Allergies  Patient Care Team: Alexandrina Fiorini, Bonna Gains, NP as PCP - General (Internal Medicine)   Medications: Outpatient Medications Prior to Visit  Medication Sig   albuterol (VENTOLIN HFA) 108 (90 Base) MCG/ACT inhaler Inhale 1-2 puffs into the lungs every 6 (six) hours as needed for wheezing or shortness of breath.   Multiple Vitamin (MULTIVITAMIN WITH MINERALS) TABS tablet Take 1 tablet by mouth daily.   [DISCONTINUED] amLODipine (NORVASC) 10 MG tablet Take 1 tablet (10 mg total) by mouth every evening.   [DISCONTINUED]  valsartan (DIOVAN) 160 MG tablet Take 1 tablet (160 mg total) by mouth every morning.   [DISCONTINUED] fluticasone (FLOVENT HFA) 110 MCG/ACT inhaler Inhale 1 puff into the lungs 2 (two) times daily. Rinse mouth after each use (Patient not taking: Reported on 06/17/2023)   [DISCONTINUED] Omega-3 Fatty Acids (FISH OIL) 1000 MG CAPS Take 1 capsule by mouth daily. (Patient not taking: Reported on 09/30/2023)   No facility-administered medications prior to visit.    Review of Systems      Objective:  BP 124/78   Pulse 64   Temp 98.3 F (36.8 C) (Temporal)   Ht 5\' 10"  (1.778 m)   Wt 187 lb 9.6 oz (85.1 kg)   SpO2 99%   BMI 26.92 kg/m     Physical Exam   No results found for any visits on 09/30/23.    Assessment & Plan:    Routine Health Maintenance and Physical Exam  Immunization History  Administered Date(s) Administered   Influenza, Seasonal, Injecte, Preservative Fre 09/30/2023   Influenza,inj,Quad PF,6+ Mos 09/21/2013, 01/03/2016, 08/24/2019, 10/07/2022   PFIZER(Purple Top)SARS-COV-2 Vaccination 03/17/2020, 04/13/2020, 11/23/2020, 09/06/2021   Tdap 10/07/2022    Health Maintenance  Topic Date Due   COVID-19 Vaccine (5 - 2023-24 season) 04/29/2024 (Originally 08/24/2023)   DTaP/Tdap/Td (2 - Td or Tdap) 10/07/2032   INFLUENZA VACCINE  Completed   Hepatitis C Screening  Completed   HIV Screening  Completed   HPV VACCINES  Aged Out    Discussed health benefits of physical activity, and encouraged him to engage in regular exercise appropriate for his age and condition.  Problem List Items Addressed This Visit     Elevated LDL cholesterol level    Repeat lipid panel      Relevant Orders   Lipid panel   Essential hypertension    BP at goal with losartan and amlodipine BP Readings from Last 3 Encounters:  09/30/23 124/78  06/17/23 133/88  05/12/23 (!) 130/90    Maintain med doses Repeat CMP F/up in 6-96months      Relevant Medications   amLODipine (NORVASC)  10 MG tablet   valsartan (DIOVAN) 160 MG tablet   Hyperglycemia    Repeat hgbA1c      Relevant Orders   Hemoglobin A1c   Other Visit Diagnoses     Encounter for preventative adult health care exam with abnormal findings    -  Primary  Relevant Orders   Comprehensive metabolic panel   Immunization due       Relevant Orders   Flu vaccine trivalent PF, 6mos and older(Flulaval,Afluria,Fluarix,Fluzone) (Completed)      Return in about 1 year (around 09/29/2024) for CPE (fasting).     Alysia Penna, NP

## 2023-09-30 NOTE — Patient Instructions (Signed)
Schedule fasting lab appt. Need to be fasting 8hrs prior to blood draw. Ok to drink water and take BP meds. Continue Heart healthy diet and daily exercise. Maintain current medications.  Preventive Care 83-36 Years Old, Male Preventive care refers to lifestyle choices and visits with your health care provider that can promote health and wellness. Preventive care visits are also called wellness exams. What can I expect for my preventive care visit? Counseling During your preventive care visit, your health care provider may ask about your: Medical history, including: Past medical problems. Family medical history. Current health, including: Emotional well-being. Home life and relationship well-being. Sexual activity. Lifestyle, including: Alcohol, nicotine or tobacco, and drug use. Access to firearms. Diet, exercise, and sleep habits. Safety issues such as seatbelt and bike helmet use. Sunscreen use. Work and work Astronomer. Physical exam Your health care provider may check your: Height and weight. These may be used to calculate your BMI (body mass index). BMI is a measurement that tells if you are at a healthy weight. Waist circumference. This measures the distance around your waistline. This measurement also tells if you are at a healthy weight and may help predict your risk of certain diseases, such as type 2 diabetes and high blood pressure. Heart rate and blood pressure. Body temperature. Skin for abnormal spots. What immunizations do I need?  Vaccines are usually given at various ages, according to a schedule. Your health care provider will recommend vaccines for you based on your age, medical history, and lifestyle or other factors, such as travel or where you work. What tests do I need? Screening Your health care provider may recommend screening tests for certain conditions. This may include: Lipid and cholesterol levels. Diabetes screening. This is done by checking your  blood sugar (glucose) after you have not eaten for a while (fasting). Hepatitis B test. Hepatitis C test. HIV (human immunodeficiency virus) test. STI (sexually transmitted infection) testing, if you are at risk. Talk with your health care provider about your test results, treatment options, and if necessary, the need for more tests. Follow these instructions at home: Eating and drinking  Eat a healthy diet that includes fresh fruits and vegetables, whole grains, lean protein, and low-fat dairy products. Drink enough fluid to keep your urine pale yellow. Take vitamin and mineral supplements as recommended by your health care provider. Do not drink alcohol if your health care provider tells you not to drink. If you drink alcohol: Limit how much you have to 0-2 drinks a day. Know how much alcohol is in your drink. In the [place] one drink equals one 12 oz bottle of beer (355 mL), one 5 oz glass of wine (148 mL), or one 1 oz glass of hard liquor (44 mL). Lifestyle Brush your teeth every morning and night with fluoride toothpaste. Floss one time each day. Exercise for at least 30 minutes 5 or more days each week. Do not use any products that contain nicotine or tobacco. These products include cigarettes, chewing tobacco, and vaping devices, such as e-cigarettes. If you need help quitting, ask your health care provider. Do not use drugs. If you are sexually active, practice safe sex. Use a condom or other form of protection to prevent STIs. Find healthy ways to manage stress, such as: Meditation, yoga, or listening to music. Journaling. Talking to a trusted person. Spending time with friends and family. Minimize exposure to UV radiation to reduce your risk of skin cancer. Safety Always wear your seat belt while  driving or riding in a vehicle. Do not drive: If you have been drinking alcohol. Do not ride with someone who has been drinking. If you have been using any mind-altering substances  or drugs. While texting. When you are tired or distracted. Wear a helmet and other protective equipment during sports activities. If you have firearms in your house, make sure you follow all gun safety procedures. Seek help if you have been physically or sexually abused. What's next? Go to your health care provider once a year for an annual wellness visit. Ask your health care provider how often you should have your eyes and teeth checked. Stay up to date on all vaccines. This information is not intended to replace advice given to you by your health care provider. Make sure you discuss any questions you have with your health care provider. Document Revised: 06/06/2021 Document Reviewed: 06/06/2021 Elsevier Patient Education  2024 [employer].

## 2023-09-30 NOTE — Assessment & Plan Note (Signed)
Repeat lipid panel ?

## 2023-09-30 NOTE — Addendum Note (Signed)
Addended by: Alysia Penna L on: 09/30/2023 03:11 PM   Modules accepted: Level of Service

## 2023-09-30 NOTE — Assessment & Plan Note (Signed)
BP at goal with losartan and amlodipine BP Readings from Last 3 Encounters:  09/30/23 124/78  06/17/23 133/88  05/12/23 (!) 130/90    Maintain med doses Repeat CMP F/up in 6-51months

## 2023-09-30 NOTE — Assessment & Plan Note (Signed)
Repeat hgbA1c 

## 2023-10-02 ENCOUNTER — Other Ambulatory Visit: Payer: Commercial Managed Care - PPO

## 2023-10-02 DIAGNOSIS — R739 Hyperglycemia, unspecified: Secondary | ICD-10-CM

## 2023-10-02 DIAGNOSIS — Z0001 Encounter for general adult medical examination with abnormal findings: Secondary | ICD-10-CM | POA: Diagnosis not present

## 2023-10-02 DIAGNOSIS — E7800 Pure hypercholesterolemia, unspecified: Secondary | ICD-10-CM

## 2023-10-02 LAB — COMPREHENSIVE METABOLIC PANEL
ALT: 50 U/L (ref 0–53)
AST: 32 U/L (ref 0–37)
Albumin: 4.9 g/dL (ref 3.5–5.2)
Alkaline Phosphatase: 65 U/L (ref 39–117)
BUN: 14 mg/dL (ref 6–23)
CO2: 28 meq/L (ref 19–32)
Calcium: 10 mg/dL (ref 8.4–10.5)
Chloride: 99 meq/L (ref 96–112)
Creatinine, Ser: 0.9 mg/dL (ref 0.40–1.50)
GFR: 109.92 mL/min (ref >60.00)
Glucose, Bld: 81 mg/dL (ref 70–99)
Potassium: 4 meq/L (ref 3.5–5.1)
Sodium: 135 meq/L (ref 135–145)
Total Bilirubin: 1.5 mg/dL — ABNORMAL HIGH (ref 0.2–1.2)
Total Protein: 7.4 g/dL (ref 6.0–8.3)

## 2023-10-02 LAB — LIPID PANEL
Cholesterol: 187 mg/dL (ref 0–200)
HDL: 59.2 mg/dL (ref >39.00)
LDL Cholesterol: 113 mg/dL — ABNORMAL HIGH (ref 0–99)
NonHDL: 128.15
Total CHOL/HDL Ratio: 3
Triglycerides: 76 mg/dL (ref 0.0–149.0)
VLDL: 15.2 mg/dL (ref 0.0–40.0)

## 2023-10-02 LAB — HEMOGLOBIN A1C: Hgb A1c MFr Bld: 5.4 % (ref 4.6–6.5)

## 2024-09-23 ENCOUNTER — Other Ambulatory Visit: Payer: Self-pay | Admitting: Nurse Practitioner

## 2024-09-23 DIAGNOSIS — J45909 Unspecified asthma, uncomplicated: Secondary | ICD-10-CM

## 2024-10-04 ENCOUNTER — Ambulatory Visit: Payer: Commercial Managed Care - PPO | Admitting: Nurse Practitioner

## 2024-10-04 ENCOUNTER — Encounter: Payer: Self-pay | Admitting: Nurse Practitioner

## 2024-10-04 VITALS — BP 144/82 | HR 76 | Temp 98.8°F | Ht 70.0 in | Wt 184.2 lb

## 2024-10-04 DIAGNOSIS — Z23 Encounter for immunization: Secondary | ICD-10-CM | POA: Diagnosis not present

## 2024-10-04 DIAGNOSIS — E7800 Pure hypercholesterolemia, unspecified: Secondary | ICD-10-CM

## 2024-10-04 DIAGNOSIS — I10 Essential (primary) hypertension: Secondary | ICD-10-CM | POA: Diagnosis not present

## 2024-10-04 DIAGNOSIS — Z0001 Encounter for general adult medical examination with abnormal findings: Secondary | ICD-10-CM

## 2024-10-04 DIAGNOSIS — R400 Somnolence: Secondary | ICD-10-CM | POA: Diagnosis not present

## 2024-10-04 DIAGNOSIS — B078 Other viral warts: Secondary | ICD-10-CM

## 2024-10-04 LAB — COMPREHENSIVE METABOLIC PANEL WITH GFR
ALT: 72 U/L — ABNORMAL HIGH (ref 0–53)
AST: 35 U/L (ref 0–37)
Albumin: 5.1 g/dL (ref 3.5–5.2)
Alkaline Phosphatase: 74 U/L (ref 39–117)
BUN: 15 mg/dL (ref 6–23)
CO2: 27 meq/L (ref 19–32)
Calcium: 9.5 mg/dL (ref 8.4–10.5)
Chloride: 99 meq/L (ref 96–112)
Creatinine, Ser: 0.89 mg/dL (ref 0.40–1.50)
GFR: 109.52 mL/min (ref >60.00)
Glucose, Bld: 98 mg/dL (ref 70–99)
Potassium: 3.8 meq/L (ref 3.5–5.1)
Sodium: 136 meq/L (ref 135–145)
Total Bilirubin: 2.2 mg/dL — ABNORMAL HIGH (ref 0.2–1.2)
Total Protein: 8.3 g/dL (ref 6.0–8.3)

## 2024-10-04 LAB — LIPID PANEL
Cholesterol: 190 mg/dL (ref 0–200)
HDL: 93 mg/dL (ref >39.00)
LDL Cholesterol: 80 mg/dL (ref 0–99)
NonHDL: 96.83
Total CHOL/HDL Ratio: 2
Triglycerides: 82 mg/dL (ref 0.0–149.0)
VLDL: 16.4 mg/dL (ref 0.0–40.0)

## 2024-10-04 LAB — CBC
HCT: 45 % (ref 39.0–52.0)
Hemoglobin: 15.2 g/dL (ref 13.0–17.0)
MCHC: 33.7 g/dL (ref 30.0–36.0)
MCV: 92.4 fl (ref 78.0–100.0)
Platelets: 304 K/uL (ref 150.0–400.0)
RBC: 4.88 Mil/uL (ref 4.22–5.81)
RDW: 13.3 % (ref 11.5–15.5)
WBC: 5.9 K/uL (ref 4.0–10.5)

## 2024-10-04 NOTE — Assessment & Plan Note (Signed)
 BP not at goal today due to diet indiscretion and increased ETOH in the last 2weeks with sudden death of mother. Reports he is compliant with med doses BP Readings from Last 3 Encounters:  10/04/24 (!) 144/82  09/30/23 124/78  06/17/23 133/88    Advised to Maintain current medications. Check BP at home in AM, no less than 3x/week. Call office if BP >140/80 for more than 1week. Bring BP readings and machine to next appointment. Maintain a low salt/sodium high potassium diet, and daily exercise (e.g. walking 20-51mins daily, calisthenics etc.) F/up in 3months or sooner

## 2024-10-04 NOTE — Assessment & Plan Note (Signed)
 Cryotherapy Obtained verbal consent fromMr. Camellia Brain. Procedure was explained. Advised about possible need to repeat procedure in 1week. Advised about possible redness at site. He verbalized understanding. Plantar's wart on left elbow, number of lesions:1 Method of Treatment: use Histofreezer to apply liquid nitrogen x 40seconds on each lesion. Well tolerated. Applied bandaid to site No complication reported.

## 2024-10-04 NOTE — Progress Notes (Signed)
 Complete physical exam  Patient: Noah Romero   DOB: Apr 26, 1986   38 y.o. Male  MRN: [id number] Visit Date: 10/04/2024  Subjective:    Chief Complaint  Patient presents with   Annual Exam    FASTING  Has Preventive Care Form    Noah Romero is a 38 y.o. male who presents today for a complete physical exam. He reports consuming a low sodium diet. Rocking climbing once a week and calisthenics daily He generally feels well. He reports sleeping poorly. He does have additional problems to discuss today.  Vision:No Dental:Yes STD Screen:No  BP Readings from Last 3 Encounters:  10/04/24 (!) 144/82  09/30/23 124/78  06/17/23 133/88   Wt Readings from Last 3 Encounters:  10/04/24 184 lb 3.2 oz (83.6 kg)  09/30/23 187 lb 9.6 oz (85.1 kg)  06/17/23 186 lb (84.4 kg)    Most recent fall risk assessment:    10/04/2024    1:29 PM  Fall Risk   Falls in the past year? 0  Injury with Fall? 0  Risk for fall due to : No Fall Risks  Follow up Falls evaluation completed     Depression screen:Yes - No Depression Most recent depression screenings:    10/04/2024    1:29 PM 09/30/2023    2:08 PM  PHQ 2/9 Scores  PHQ - 2 Score 4 2  PHQ- 9 Score 13 6    HPI  Other viral warts Cryotherapy Obtained verbal consent fromMr. Noah Romero. Procedure was explained. Advised about possible need to repeat procedure in 1week. Advised about possible redness at site. He verbalized understanding. Plantar's wart on left elbow, number of lesions:1 Method of Treatment: use Histofreezer to apply liquid nitrogen x 40seconds on each lesion. Well tolerated. Applied bandaid to site No complication reported.   Essential hypertension BP not at goal today due to diet indiscretion and increased ETOH in the last 2weeks with sudden death of mother. Reports he is compliant with med doses BP Readings from Last 3 Encounters:  10/04/24 (!) 144/82  09/30/23 124/78  06/17/23 133/88    Advised to Maintain current  medications. Check BP at home in AM, no less than 3x/week. Call office if BP >140/80 for more than 1week. Bring BP readings and machine to next appointment. Maintain a low salt/sodium high potassium diet, and daily exercise (e.g. walking 20-81mins daily, calisthenics etc.) F/up in 3months or sooner  Elevated LDL cholesterol level Repeat lipid panel  Uncontrolled daytime somnolence Persistent Advised to schedule f/up appointment with sleep specialist   Past Medical History:  Diagnosis Date   Allergy    Asthma    Elevated LFTs 07/06/2020   Past Surgical History:  Procedure Laterality Date   NO PAST SURGERIES     Social History   Socioeconomic History   Marital status: Married    Spouse name: Not on file   Number of children: 2   Years of education: Not on file   Highest education level: Not on file  Occupational History   Occupation: Leisure centre manager , college    Employer: [employer]  Tobacco Use   Smoking status: Former    Types: Cigarettes, Cigars   Smokeless tobacco: Never   Tobacco comments:    No cigar in last 2 years, 1 cigarette in last 2 years. Occasionally nicotine pouch  Vaping Use   Vaping status: Former   Substances: Nicotine, Flavoring  Substance and Sexual Activity   Alcohol use: Yes    Alcohol/week:  14.0 standard drinks of alcohol    Types: 14 Cans of beer per week   Drug use: No   Sexual activity: Yes    Birth control/protection: None  Other Topics Concern   Not on file  Social History Narrative   Caffeine: 0 cups per day   Social Drivers of Corporate investment banker Strain: Not on file  Food Insecurity: Not on file  Transportation Needs: Not on file  Physical Activity: Not on file  Stress: Not on file  Social Connections: Not on file  Intimate Partner Violence: Not on file   Family Status  Relation Name Status   Mother  Deceased   Father  Deceased   Sister  Alive   MGM  Deceased at age 46   MGF  Deceased   PGM  Deceased   PGF  Deceased  at age 19   Neg Hx  (Not Specified)  No partnership data on file   Family History  Problem Relation Age of Onset   Diabetes Mother    Hyperlipidemia Father    Hypertension Father    Hearing loss Maternal Grandfather 18       CAD with MI, cause of death   Birth defects Paternal Grandfather    Cancer Paternal Grandfather 32       Prostate   Colon cancer Neg Hx    Sleep apnea Neg Hx    No Known Allergies  Patient Care Team: Johntay Doolen, Roselie Rockford, NP as PCP - General (Internal Medicine)   Medications: Outpatient Medications Prior to Visit  Medication Sig   albuterol  (VENTOLIN  HFA) 108 (90 Base) MCG/ACT inhaler INHALE 1 TO 2 PUFFS INTO THE LUNGS EVERY 6 HOURS AS NEEDED FOR WHEEZING OR SHORTNESS OF BREATH   amLODipine  (NORVASC ) 10 MG tablet Take 1 tablet (10 mg total) by mouth every evening.   Multiple Vitamin (MULTIVITAMIN WITH MINERALS) TABS tablet Take 1 tablet by mouth daily.   valsartan  (DIOVAN ) 160 MG tablet Take 1 tablet (160 mg total) by mouth every morning.   No facility-administered medications prior to visit.    Review of Systems  Constitutional:  Negative for activity change, appetite change and unexpected weight change.  Respiratory: Negative.    Cardiovascular: Negative.   Gastrointestinal: Negative.   Endocrine: Negative for cold intolerance and heat intolerance.  Genitourinary: Negative.   Musculoskeletal: Negative.   Skin:  Positive for rash.  Neurological: Negative.   Hematological: Negative.   Psychiatric/Behavioral:  Negative for behavioral problems, decreased concentration, dysphoric mood, hallucinations, self-injury, sleep disturbance and suicidal ideas. The patient is not nervous/anxious.        Objective:  BP (!) 144/82 (BP Location: Left Arm, Patient Position: Sitting, Cuff Size: Large)   Pulse 76   Temp 98.8 F (37.1 C) (Oral)   Ht 5' 10 (1.778 m)   Wt 184 lb 3.2 oz (83.6 kg)   SpO2 97%   BMI 26.43 kg/m     Physical Exam Vitals and  nursing note reviewed.  Constitutional:      General: He is not in acute distress. HENT:     Right Ear: Tympanic membrane, ear canal and external ear normal.     Left Ear: Tympanic membrane, ear canal and external ear normal.     Nose: Nose normal.  Eyes:     Extraocular Movements: Extraocular movements intact.     Conjunctiva/sclera: Conjunctivae normal.     Pupils: Pupils are equal, round, and reactive to light.  Neck:  Thyroid : No thyroid  mass, thyromegaly or thyroid  tenderness.  Cardiovascular:     Rate and Rhythm: Normal rate and regular rhythm.     Pulses: Normal pulses.     Heart sounds: Normal heart sounds.  Pulmonary:     Effort: Pulmonary effort is normal.     Breath sounds: Normal breath sounds.  Abdominal:     General: Bowel sounds are normal.     Palpations: Abdomen is soft.  Musculoskeletal:        General: Normal range of motion.     Cervical back: Normal range of motion and neck supple.     Right lower leg: No edema.     Left lower leg: No edema.  Lymphadenopathy:     Cervical: No cervical adenopathy.  Skin:    General: Skin is warm and dry.  Neurological:     Mental Status: He is alert and oriented to person, place, and time.     Cranial Nerves: No cranial nerve deficit.  Psychiatric:        Mood and Affect: Mood normal.        Behavior: Behavior normal.        Thought Content: Thought content normal.     No results found for any visits on 10/04/24.    Assessment & Plan:    Routine Health Maintenance and Physical Exam  Immunization History  Administered Date(s) Administered   Hep B, Unspecified 09/21/1998, 10/26/1998, 03/22/1999   Influenza, Seasonal, Injecte, Preservative Fre 09/30/2023, 10/04/2024   Influenza,inj,Quad PF,6+ Mos 09/21/2013, 01/03/2016, 08/24/2019, 10/07/2022   Influenza-Unspecified 10/07/2022   Moderna Covid-19 Fall Seasonal Vaccine 94yrs & older 10/09/2022   PFIZER(Purple Top)SARS-COV-2 Vaccination 03/17/2020, 04/13/2020,  11/23/2020, 09/06/2021   Pfizer Covid-19 Vaccine Bivalent Booster 109yrs & up 09/06/2021   Tdap 10/07/2022   Health Maintenance  Topic Date Due   HPV VACCINES (1 - 3-dose SCDM series) Never done   COVID-19 Vaccine (7 - 2025-26 season) 08/23/2024   Pneumococcal Vaccine (1 of 2 - PCV) 10/04/2025 (Originally 04/20/2006)   DTaP/Tdap/Td (2 - Td or Tdap) 10/07/2032   Influenza Vaccine  Completed   Hepatitis C Screening  Completed   HIV Screening  Completed   Meningococcal B Vaccine  Aged Out   Discussed health benefits of physical activity, and encouraged him to engage in regular exercise appropriate for his age and condition.  Problem List Items Addressed This Visit     Elevated LDL cholesterol level   Repeat lipid panel      Relevant Orders   Lipid panel   Essential hypertension   BP not at goal today due to diet indiscretion and increased ETOH in the last 2weeks with sudden death of mother. Reports he is compliant with med doses BP Readings from Last 3 Encounters:  10/04/24 (!) 144/82  09/30/23 124/78  06/17/23 133/88    Advised to Maintain current medications. Check BP at home in AM, no less than 3x/week. Call office if BP >140/80 for more than 1week. Bring BP readings and machine to next appointment. Maintain a low salt/sodium high potassium diet, and daily exercise (e.g. walking 20-19mins daily, calisthenics etc.) F/up in 3months or sooner      Other viral warts   Cryotherapy Obtained verbal consent fromMr. Noah Romero. Procedure was explained. Advised about possible need to repeat procedure in 1week. Advised about possible redness at site. He verbalized understanding. Plantar's wart on left elbow, number of lesions:1 Method of Treatment: use Histofreezer to apply liquid nitrogen x 40seconds on  each lesion. Well tolerated. Applied bandaid to site No complication reported.       Uncontrolled daytime somnolence   Persistent Advised to schedule f/up appointment with sleep  specialist      Other Visit Diagnoses       Encounter for preventative adult health care exam with abnormal findings    -  Primary   Relevant Orders   Comprehensive metabolic panel with GFR   CBC     Need for influenza vaccination       Relevant Orders   Flu vaccine trivalent PF, 6mos and older(Flulaval,Afluria,Fluarix,Fluzone) (Completed)      Return in about 3 months (around 01/04/2025) for HTN.     Roselie Mood, NP

## 2024-10-04 NOTE — Assessment & Plan Note (Signed)
 Persistent Advised to schedule f/up appointment with sleep specialist

## 2024-10-04 NOTE — Assessment & Plan Note (Signed)
 Repeat lipid panel ?

## 2024-10-04 NOTE — Patient Instructions (Signed)
 Schedule appointment with sleep specialist. Go to lab Maintain Heart healthy diet and daily exercise. Maintain current medications. Check BP at home in AM, no less than 3x/week. Call office if BP >140/80 for more than 1week. Bring BP readings and machine to next appointment. Maintain a low salt/sodium high potassium diet, and daily exercise (e.g. walking 20-18mins daily, calisthenics etc.)

## 2024-10-06 ENCOUNTER — Ambulatory Visit: Payer: Self-pay | Admitting: Nurse Practitioner

## 2024-10-06 DIAGNOSIS — R7401 Elevation of levels of liver transaminase levels: Secondary | ICD-10-CM

## 2025-01-05 ENCOUNTER — Encounter: Payer: Self-pay | Admitting: Nurse Practitioner

## 2025-01-05 ENCOUNTER — Ambulatory Visit: Admitting: Nurse Practitioner

## 2025-01-05 VITALS — BP 136/82 | HR 63 | Temp 98.0°F | Ht 70.0 in | Wt 191.6 lb

## 2025-01-05 DIAGNOSIS — I10 Essential (primary) hypertension: Secondary | ICD-10-CM

## 2025-01-05 DIAGNOSIS — E7800 Pure hypercholesterolemia, unspecified: Secondary | ICD-10-CM | POA: Diagnosis not present

## 2025-01-05 DIAGNOSIS — R7401 Elevation of levels of liver transaminase levels: Secondary | ICD-10-CM

## 2025-01-05 DIAGNOSIS — R400 Somnolence: Secondary | ICD-10-CM

## 2025-01-05 LAB — LIPID PANEL
Cholesterol: 155 mg/dL (ref 28–200)
HDL: 59.1 mg/dL
LDL Cholesterol: 83 mg/dL (ref 10–99)
NonHDL: 96.01
Total CHOL/HDL Ratio: 3
Triglycerides: 66 mg/dL (ref 10.0–149.0)
VLDL: 13.2 mg/dL (ref 0.0–40.0)

## 2025-01-05 LAB — COMPREHENSIVE METABOLIC PANEL WITH GFR
ALT: 32 U/L (ref 3–53)
AST: 24 U/L (ref 5–37)
Albumin: 4.8 g/dL (ref 3.5–5.2)
Alkaline Phosphatase: 55 U/L (ref 39–117)
BUN: 17 mg/dL (ref 6–23)
CO2: 30 meq/L (ref 19–32)
Calcium: 9.4 mg/dL (ref 8.4–10.5)
Chloride: 99 meq/L (ref 96–112)
Creatinine, Ser: 0.95 mg/dL (ref 0.40–1.50)
GFR: 102.11 mL/min
Glucose, Bld: 84 mg/dL (ref 70–99)
Potassium: 4.5 meq/L (ref 3.5–5.1)
Sodium: 135 meq/L (ref 135–145)
Total Bilirubin: 1.2 mg/dL (ref 0.2–1.2)
Total Protein: 7.9 g/dL (ref 6.0–8.3)

## 2025-01-05 MED ORDER — VALSARTAN 160 MG PO TABS: 160.0000 mg | ORAL_TABLET | ORAL | 3 refills | Status: AC

## 2025-01-05 MED ORDER — AMLODIPINE BESYLATE 10 MG PO TABS: 10.0000 mg | ORAL_TABLET | Freq: Every evening | ORAL | 3 refills | Status: AC

## 2025-01-05 NOTE — Progress Notes (Signed)
 "               Established Patient Visit  Patient: Noah Romero   DOB: 07-Dec-1986   39 y.o. Male  MRN: [id number] Visit Date: 01/05/2025  Subjective:    Chief Complaint  Patient presents with   Follow-up    3 month follow up for HTN   HPI Essential hypertension Home BP: 130s/80s Current use of losartan 160mg  and amlodipine  10mg . No adverse effects Exercise: high intensity cardio weekly, resistant training daily. Diet: low sodium diet No etoh, no tobacco, no DIABETES, no Fhx of premature CAD Did not complete sleep study as recommended BP Readings from Last 3 Encounters:  01/05/25 136/82  10/04/24 (!) 144/82  09/30/23 124/78    Check CMP Maintain med dose Advised to schedule appointment with [facility] F/up in 3months   Reviewed medical, surgical, and social history today  Medications: Show/hide medication list[1] Reviewed past medical and social history.   ROS per HPI above      Objective:  BP 136/82 (BP Location: Left Arm, Patient Position: Sitting, Cuff Size: Normal)   Pulse 63   Temp 98 F (36.7 C) (Oral)   Ht 5' 10 (1.778 m)   Wt 191 lb 9.6 oz (86.9 kg)   SpO2 100%   BMI 27.49 kg/m      Physical Exam Vitals and nursing note reviewed.  Cardiovascular:     Rate and Rhythm: Normal rate.     Pulses: Normal pulses.  Pulmonary:     Effort: Pulmonary effort is normal.  Musculoskeletal:     Right lower leg: No edema.     Left lower leg: No edema.  Neurological:     Mental Status: He is alert and oriented to person, place, and time.  Psychiatric:        Mood and Affect: Mood normal.        Behavior: Behavior normal.        Thought Content: Thought content normal.     No results found for any visits on 01/05/25.    Assessment & Plan:    Problem List Items Addressed This Visit     Elevated LDL cholesterol level   Relevant Orders   Lipid panel   Comprehensive metabolic panel with GFR   Essential hypertension   Home BP: 130s/80s Current use of  losartan 160mg  and amlodipine  10mg . No adverse effects Exercise: high intensity cardio weekly, resistant training daily. Diet: low sodium diet No etoh, no tobacco, no DIABETES, no Fhx of premature CAD Did not complete sleep study as recommended BP Readings from Last 3 Encounters:  01/05/25 136/82  10/04/24 (!) 144/82  09/30/23 124/78    Check CMP Maintain med dose Advised to schedule appointment with [facility] F/up in 3months      Relevant Medications   amLODipine  (NORVASC ) 10 MG tablet   valsartan  (DIOVAN ) 160 MG tablet   Other Relevant Orders   Comprehensive metabolic panel with GFR   Uncontrolled daytime somnolence   Other Visit Diagnoses       Elevated AST (SGOT)    -  Primary   Relevant Orders   Comprehensive metabolic panel with GFR      Return in about 3 months (around 04/05/2025) for HTN.     Roselie Mood, NP      [1]  Outpatient Medications Prior to Visit  Medication Sig   albuterol  (VENTOLIN  HFA) 108 (90 Base) MCG/ACT inhaler INHALE 1 TO 2 PUFFS INTO THE LUNGS EVERY  6 HOURS AS NEEDED FOR WHEEZING OR SHORTNESS OF BREATH   Multiple Vitamin (MULTIVITAMIN WITH MINERALS) TABS tablet Take 1 tablet by mouth daily.   [DISCONTINUED] amLODipine  (NORVASC ) 10 MG tablet Take 1 tablet (10 mg total) by mouth every evening.   [DISCONTINUED] valsartan  (DIOVAN ) 160 MG tablet Take 1 tablet (160 mg total) by mouth every morning.   No facility-administered medications prior to visit.   "

## 2025-01-05 NOTE — Patient Instructions (Addendum)
 Please schedule appointment with [facility]: [phone].  Go to lab Maintain Heart healthy diet and daily exercise. Maintain current medications.

## 2025-01-05 NOTE — Assessment & Plan Note (Addendum)
 Home BP: 130s/80s Current use of losartan 160mg  and amlodipine  10mg . No adverse effects Exercise: high intensity cardio weekly, resistant training daily. Diet: low sodium diet No etoh, no tobacco, no DIABETES, no Fhx of premature CAD Did not complete sleep study as recommended BP Readings from Last 3 Encounters:  01/05/25 136/82  10/04/24 (!) 144/82  09/30/23 124/78    Check CMP Maintain med dose Advised to schedule appointment with [facility] F/up in 3months

## 2025-01-06 ENCOUNTER — Ambulatory Visit: Payer: Self-pay | Admitting: Nurse Practitioner

## 2025-04-06 ENCOUNTER — Ambulatory Visit: Admitting: Nurse Practitioner

## 2025-10-06 ENCOUNTER — Encounter: Admitting: Nurse Practitioner
